# Patient Record
Sex: Female | Born: 1960
Health system: Southern US, Community
[De-identification: ages and names within clinical notes are randomized; demographics above are authoritative.]

## PROBLEM LIST (undated history)

## (undated) DIAGNOSIS — E119 Type 2 diabetes mellitus without complications: Secondary | ICD-10-CM

## (undated) DIAGNOSIS — E785 Hyperlipidemia, unspecified: Secondary | ICD-10-CM

## (undated) DIAGNOSIS — I10 Essential (primary) hypertension: Secondary | ICD-10-CM

## (undated) HISTORY — DX: Type 2 diabetes mellitus without complications: E11.9

## (undated) HISTORY — PX: ANKLE SURGERY: SHX546

## (undated) HISTORY — DX: Essential (primary) hypertension: I10

## (undated) HISTORY — DX: Hyperlipidemia, unspecified: E78.5

---

## 2015-10-29 ENCOUNTER — Encounter: Payer: Self-pay | Admitting: Family Medicine

## 2015-10-29 ENCOUNTER — Ambulatory Visit (INDEPENDENT_AMBULATORY_CARE_PROVIDER_SITE_OTHER): Payer: Self-pay | Admitting: Family Medicine

## 2015-10-29 VITALS — BP 129/60 | HR 82 | Ht 61.0 in | Wt 192.0 lb

## 2015-10-29 DIAGNOSIS — L649 Androgenic alopecia, unspecified: Secondary | ICD-10-CM | POA: Insufficient documentation

## 2015-10-29 DIAGNOSIS — E1165 Type 2 diabetes mellitus with hyperglycemia: Secondary | ICD-10-CM

## 2015-10-29 DIAGNOSIS — E669 Obesity, unspecified: Secondary | ICD-10-CM

## 2015-10-29 DIAGNOSIS — F17200 Nicotine dependence, unspecified, uncomplicated: Secondary | ICD-10-CM

## 2015-10-29 DIAGNOSIS — I10 Essential (primary) hypertension: Secondary | ICD-10-CM | POA: Insufficient documentation

## 2015-10-29 DIAGNOSIS — IMO0001 Reserved for inherently not codable concepts without codable children: Secondary | ICD-10-CM | POA: Insufficient documentation

## 2015-10-29 DIAGNOSIS — E1159 Type 2 diabetes mellitus with other circulatory complications: Secondary | ICD-10-CM | POA: Insufficient documentation

## 2015-10-29 DIAGNOSIS — I152 Hypertension secondary to endocrine disorders: Secondary | ICD-10-CM | POA: Insufficient documentation

## 2015-10-29 DIAGNOSIS — Z720 Tobacco use: Secondary | ICD-10-CM

## 2015-10-29 DIAGNOSIS — E782 Mixed hyperlipidemia: Secondary | ICD-10-CM | POA: Insufficient documentation

## 2015-10-29 NOTE — Patient Instructions (Signed)
Thank you for coming in today. Increase metformin to 1000mg  twice daily  Return in 1-3 months or sooner if needed.   Work on smoking cessation.

## 2015-10-29 NOTE — Progress Notes (Signed)
Melanie Nelson is a 55 y.o. female who presents [name] today to establish care.  Patient's only complaint is difficulty walking since her left ankle surgery to remove a neuroma years ago.  Denies numbness, tingling, and weakness.  Her medical problems being addressed during this visit are DM, HTN, HLD, and tobacco abuse.    Diabetes Mellitus: Says her sugar has been around 180 in the am and 100 at night after stopping her Jardiance 25 mg 2 weeks ago due to lack of insurance.  Patient states her A1c was 9.0 four months ago.  Admits to gaining 10 pounds over the last couple months and feeling more stressed because her family recently moved here from [place].  Patient reports decreased exercise and increased processed food.  Her last foot and eye exam were last year.  Denies polyuria and polydipsia.  Also takes aspirin 81 mg daily.    Hypertension:  BP controlled to 120-130s/ 80 at home per patient.  Elevated to 164/89 in the office.  Currently taking lisinopril 20 mg daily and metoprolol 100 mg daily. Denies chest pain and palpitations.    Tobacco abuse:  Currently smokes 1/3 to 1/2 pack per day for the last 20 years.  Has tried quitting but is not interested at this time due to stress from recent move.    Past Medical History  Diagnosis Date  . Diabetes mellitus without complication (HCC)   . Hypertension   . Hyperlipidemia    Past Surgical History  Procedure Laterality Date  . Ankle surgery     Social History  Substance Use Topics  . Smoking status: Current Every Day Smoker -- 0.50 packs/day for 20 years  . Smokeless tobacco: Not on file  . Alcohol Use: 0.0 oz/week    0 Standard drinks or equivalent per week     Comment: Drinks 6-7 beers in one day socially every couple weeks   family history includes Alcohol abuse in her father; Aneurysm in her sister;  Diabetes in her mother; Heart disease in her father.  ROS as above No headache, visual changes, nausea, vomiting, diarrhea, constipation, dizziness, abdominal pain, skin rash, fevers, chills, night sweats, weight loss, swollen lymph nodes, body aches, joint swelling, muscle aches, chest pain, shortness of breath, mood changes, visual or auditory hallucinations.    Medications: Current Outpatient Prescriptions  Medication Sig Dispense Refill  . glimepiride (AMARYL) 4 MG tablet Take 4 mg by mouth daily with breakfast.    . LISINOPRIL PO Take 20 mg by mouth.     . metFORMIN (GLUCOPHAGE) 500 MG tablet Take 500 mg by mouth 2 (two) times daily with a meal.    . metoprolol (LOPRESSOR) 100 MG tablet Take 100 mg by mouth 2 (two) times daily.    . rosuvastatin (CRESTOR) 40 MG tablet Take 40 mg by mouth daily.     No current facility-administered medications for this visit.   Allergies  Allergen Reactions  . Sulfa Antibiotics      Exam:  BP 129/60 mmHg  Pulse 82  Ht 5\' 1"  (1.549 m)  Wt 192 lb (87.091 kg)  BMI 36.30 kg/m2 Gen: Well NAD, smells of cigarette smoke. HEENT:  MMM Lungs: Normal work of breathing. CTABL Heart: RRR no MRG Abd: NABS, Soft. Nondistended, Nontender, Obese Exts: Patellar reflexes 2+.  Strength 5/5 bilaterally in her lower extremities.  Sensation intact to light touch.   Scalp: Alopecia pattern consistent with female  pattern type.  No results found for this or any previous visit (from the past 24 hour(s)). No results found.    Assessment and Plan: 55 y.o. female with multiple comorbid conditions presents to establish care.  Her care is complicated by recent loss of health insurance. Fortunately she expects to regain health insurance in the next month or so. Therefore think it's reasonable to defer extensive laboratory assessment at least for a few months until it will be more affordable for her.   Will increase her metformin to 1000 mg BID.  Follow up in 1-3 months  for recheck of blood pressure, labs, and health maintenance.  Encouraged her to cut back  cigarette smoking and to increase walking.      Discussed warning signs or symptoms. Please see discharge instructions. Patient expresses understanding.

## 2015-11-13 ENCOUNTER — Telehealth: Payer: Self-pay

## 2015-11-13 NOTE — Telephone Encounter (Signed)
[facility] called and state they tried to transfer Melanie Nelson's medication and some how it was lost in the transfer. She needs refill on all her medications. The medications have an historical provider. She needs Lisinopril 20 mg once daily, glimepiride 4 mg once daily, Crestor 40 mg once daily and Metoprolol 100 mg bid. [facility] [employer] [address]

## 2015-11-16 MED ORDER — GLIMEPIRIDE 4 MG PO TABS: 4.0000 mg | ORAL_TABLET | Freq: Every day | ORAL | 0 refills | Status: DC

## 2015-11-16 MED ORDER — LISINOPRIL 20 MG PO TABS: 20.0000 mg | ORAL_TABLET | Freq: Every day | ORAL | 0 refills | Status: DC

## 2015-11-16 MED ORDER — ROSUVASTATIN CALCIUM 40 MG PO TABS: 40.0000 mg | ORAL_TABLET | Freq: Every day | ORAL | 0 refills | Status: DC

## 2015-11-16 MED ORDER — METOPROLOL TARTRATE 100 MG PO TABS: 100.0000 mg | ORAL_TABLET | Freq: Two times a day (BID) | ORAL | 0 refills | Status: DC

## 2015-11-16 NOTE — Telephone Encounter (Signed)
Melanie Nelson does not have insurance and the Crestor cost is around $ 400. She would like a cheaper statin. Please advise.

## 2015-11-16 NOTE — Telephone Encounter (Signed)
Meds refilled.

## 2015-11-17 MED ORDER — ATORVASTATIN CALCIUM 40 MG PO TABS: 40.0000 mg | ORAL_TABLET | Freq: Every day | ORAL | 0 refills | Status: DC

## 2015-11-17 NOTE — Addendum Note (Signed)
Addended by: Rodolph BongOREY, Shayde Gervacio S on: 11/17/2015 07:28 AM   Modules accepted: Orders

## 2015-11-17 NOTE — Telephone Encounter (Signed)
Patient advised of recommendations.  

## 2015-11-17 NOTE — Telephone Encounter (Signed)
Will switch to lipitor

## 2015-12-03 ENCOUNTER — Encounter: Payer: Self-pay | Admitting: Family Medicine

## 2016-01-26 LAB — CBC
HEMATOCRIT: 42 % (ref 35.0–45.0)
HEMOGLOBIN: 13.8 g/dL (ref 11.7–15.5)
MCH: 28.6 pg (ref 27.0–33.0)
MCHC: 32.9 g/dL (ref 32.0–36.0)
MCV: 87 fL (ref 80.0–100.0)
MPV: 9.8 fL (ref 7.5–12.5)
PLATELETS: 228 10*3/uL (ref 140–400)
RBC: 4.83 MIL/uL (ref 3.80–5.10)
RDW: 14.1 % (ref 11.0–15.0)
WBC: 8.5 10*3/uL (ref 3.8–10.8)

## 2016-01-26 LAB — HEMOGLOBIN A1C
HEMOGLOBIN A1C: 9.2 % — AB (ref <5.7)
MEAN PLASMA GLUCOSE: 217 mg/dL

## 2016-01-27 LAB — COMPREHENSIVE METABOLIC PANEL
ALBUMIN: 4.1 g/dL (ref 3.6–5.1)
ALT: 37 U/L — ABNORMAL HIGH (ref 6–29)
AST: 25 U/L (ref 10–35)
Alkaline Phosphatase: 66 U/L (ref 33–130)
BUN: 21 mg/dL (ref 7–25)
CALCIUM: 9.1 mg/dL (ref 8.6–10.4)
CHLORIDE: 102 mmol/L (ref 98–110)
CO2: 24 mmol/L (ref 20–31)
Creat: 0.65 mg/dL (ref 0.50–1.05)
Glucose, Bld: 308 mg/dL — ABNORMAL HIGH (ref 65–99)
POTASSIUM: 4.4 mmol/L (ref 3.5–5.3)
Sodium: 138 mmol/L (ref 135–146)
TOTAL PROTEIN: 6.5 g/dL (ref 6.1–8.1)
Total Bilirubin: 0.4 mg/dL (ref 0.2–1.2)

## 2016-01-27 LAB — LIPID PANEL
CHOLESTEROL: 173 mg/dL (ref 125–200)
HDL: 26 mg/dL — AB (ref >=46)
LDL Cholesterol: 81 mg/dL (ref <130)
TRIGLYCERIDES: 330 mg/dL — AB (ref <150)
Total CHOL/HDL Ratio: 6.7 Ratio — ABNORMAL HIGH (ref <=5.0)
VLDL: 66 mg/dL — ABNORMAL HIGH (ref <30)

## 2016-01-28 ENCOUNTER — Ambulatory Visit (INDEPENDENT_AMBULATORY_CARE_PROVIDER_SITE_OTHER): Payer: BLUE CROSS/BLUE SHIELD | Admitting: Family Medicine

## 2016-01-28 VITALS — BP 163/78 | HR 70 | Wt 197.0 lb

## 2016-01-28 DIAGNOSIS — E6609 Other obesity due to excess calories: Secondary | ICD-10-CM

## 2016-01-28 DIAGNOSIS — Z6836 Body mass index (BMI) 36.0-36.9, adult: Secondary | ICD-10-CM

## 2016-01-28 DIAGNOSIS — J302 Other seasonal allergic rhinitis: Secondary | ICD-10-CM | POA: Insufficient documentation

## 2016-01-28 DIAGNOSIS — E1165 Type 2 diabetes mellitus with hyperglycemia: Secondary | ICD-10-CM | POA: Diagnosis not present

## 2016-01-28 DIAGNOSIS — I10 Essential (primary) hypertension: Secondary | ICD-10-CM | POA: Diagnosis not present

## 2016-01-28 DIAGNOSIS — J301 Allergic rhinitis due to pollen: Secondary | ICD-10-CM

## 2016-01-28 DIAGNOSIS — IMO0001 Reserved for inherently not codable concepts without codable children: Secondary | ICD-10-CM

## 2016-01-28 DIAGNOSIS — E782 Mixed hyperlipidemia: Secondary | ICD-10-CM | POA: Diagnosis not present

## 2016-01-28 DIAGNOSIS — Z23 Encounter for immunization: Secondary | ICD-10-CM | POA: Diagnosis not present

## 2016-01-28 DIAGNOSIS — Z1239 Encounter for other screening for malignant neoplasm of breast: Secondary | ICD-10-CM

## 2016-01-28 DIAGNOSIS — F17200 Nicotine dependence, unspecified, uncomplicated: Secondary | ICD-10-CM

## 2016-01-28 DIAGNOSIS — Z1211 Encounter for screening for malignant neoplasm of colon: Secondary | ICD-10-CM

## 2016-01-28 MED ORDER — FLUTICASONE PROPIONATE 50 MCG/ACT NA SUSP: 2.0000 | Freq: Every day | NASAL | 12 refills | Status: DC

## 2016-01-28 MED ORDER — EMPAGLIFLOZIN 25 MG PO TABS: 25.0000 mg | ORAL_TABLET | Freq: Every day | ORAL | 3 refills | Status: DC

## 2016-01-28 MED ORDER — ROSUVASTATIN CALCIUM 40 MG PO TABS: 40.0000 mg | ORAL_TABLET | Freq: Every day | ORAL | 3 refills | Status: DC

## 2016-01-28 MED ORDER — LISINOPRIL 20 MG PO TABS: 20.0000 mg | ORAL_TABLET | Freq: Every day | ORAL | 3 refills | Status: DC

## 2016-01-28 MED ORDER — METOPROLOL TARTRATE 100 MG PO TABS: 100.0000 mg | ORAL_TABLET | Freq: Two times a day (BID) | ORAL | 3 refills | Status: DC

## 2016-01-28 MED ORDER — SITAGLIPTIN PHOS-METFORMIN HCL 50-1000 MG PO TABS: 1.0000 | ORAL_TABLET | Freq: Two times a day (BID) | ORAL | 3 refills | Status: DC

## 2016-01-28 MED ORDER — GLUCOSE BLOOD VI STRP: ORAL_STRIP | 12 refills | Status: DC

## 2016-01-28 NOTE — Progress Notes (Signed)
Melanie Nelson is a 55 y.o. female who [name] today for follow-up diabetes hyperlipidemia and hypertension.  Patient was originally seen when she did not have health insurance and wanted to defer extensive workup until she obtained health insurance. She now has health insurance.  Diabetes: Poorly controlled recently. She patient has elevated blood sugars. No polyuria or polydipsia fevers or chills. She currently takes Amaryl and metformin. In the past she did very well with Jardiance.  Hypertension: Managed with lisinopril and metoprolol. No chest pain palpitations or shortness of breath.  Hyperlipidemia: Managed with Crestor. No muscle aches or pain.  Patient notes runny nose cough congestion and itchy scratchy eyes. Symptoms consistent with previous allergy symptoms. Patient would like a prescription for Flonase nasal spray if possible.  Health maintenance: Patient is due for colonoscopy and mammogram as well as vaccines listed below.   Past Medical History:  Diagnosis Date  . Diabetes mellitus without complication (HCC)   . Hyperlipidemia   . Hypertension    Past Surgical History:  Procedure Laterality Date  . ANKLE SURGERY     Social History  Substance Use Topics  . Smoking status: Current Every Day Smoker    Packs/day: 0.50    Years: 20.00  . Smokeless tobacco: Not on file  . Alcohol use 0.0 oz/week     Comment: Drinks 6-7 beers in one day socially every couple weeks   family history includes Alcohol abuse in her father; Aneurysm in her sister; Diabetes in her mother; Heart disease in her father.  ROS as above:  Medications: Current Outpatient Prescriptions  Medication Sig Dispense Refill  . lisinopril (PRINIVIL,ZESTRIL) 20 MG tablet Take 1 tablet (20 mg total) by mouth daily. 90 tablet 3  . metoprolol (LOPRESSOR) 100 MG tablet  Take 1 tablet (100 mg total) by mouth 2 (two) times daily. 180 tablet 3  . rosuvastatin (CRESTOR) 40 MG tablet Take 1 tablet (40 mg total) by mouth daily. 90 tablet 3  . empagliflozin (JARDIANCE) 25 MG TABS tablet Take 25 mg by mouth daily. 90 tablet 3  . fluticasone (FLONASE) 50 MCG/ACT nasal spray Place 2 sprays into both nostrils daily. 16 g 12  . glucose blood (BAYER CONTOUR NEXT TEST) test strip Test daily. E11.65 100 each 12  . sitaGLIPtin-metformin (JANUMET) 50-1000 MG tablet Take 1 tablet by mouth 2 (two) times daily with a meal. 180 tablet 3   No current facility-administered medications for this visit.    Allergies  Allergen Reactions  . Sulfa Antibiotics     Health Maintenance Health Maintenance  Topic Date Due  . Hepatitis C Screening  04-06-1961  . PNEUMOCOCCAL POLYSACCHARIDE VACCINE (1) 06/11/1961  . FOOT EXAM  06/11/1969  . OPHTHALMOLOGY EXAM  06/11/1969  . HIV Screening  06/12/1974  . TETANUS/TDAP  06/12/1978  . PAP SMEAR  06/11/1980  . MAMMOGRAM  06/11/2009  . COLONOSCOPY  06/11/2009  . INFLUENZA VACCINE  11/10/2015  . HEMOGLOBIN A1C  07/26/2016     Exam:  BP (!) 163/78   Pulse 70   Wt 197 lb (89.4 kg)   BMI 37.22 kg/m  Gen: Well NAD HEENT: EOMI,  MMM Clear nasal discharge Lungs: Normal work of breathing. CTABL Heart: RRR no MRG Abd: NABS, Soft. Nondistended, Nontender Exts: Brisk capillary refill, warm and well perfused.  Diabetic Foot Exam - Simple   Simple Foot Form Visual Inspection No deformities, no ulcerations, no other skin  breakdown bilaterally:  Yes Sensation Testing Intact to touch and monofilament testing bilaterally:  Yes Pulse Check Posterior Tibialis and Dorsalis pulse intact bilaterally:  Yes Comments       Results for orders placed or performed in visit on 10/29/15 (from the past 72 hour(s))  CBC     Status: None   Collection Time: 01/26/16  8:18 AM  Result Value Ref Range   WBC 8.5 3.8 - 10.8 K/uL   RBC 4.83 3.80 - 5.10  MIL/uL   Hemoglobin 13.8 11.7 - 15.5 g/dL   HCT 81.1 91.4 - 78.2 %   MCV 87.0 80.0 - 100.0 fL   MCH 28.6 27.0 - 33.0 pg   MCHC 32.9 32.0 - 36.0 g/dL   RDW 95.6 21.3 - 08.6 %   Platelets 228 140 - 400 K/uL   MPV 9.8 7.5 - 12.5 fL  Comprehensive metabolic panel     Status: Abnormal   Collection Time: 01/26/16  8:18 AM  Result Value Ref Range   Sodium 138 135 - 146 mmol/L   Potassium 4.4 3.5 - 5.3 mmol/L   Chloride 102 98 - 110 mmol/L   CO2 24 20 - 31 mmol/L   Glucose, Bld 308 (H) 65 - 99 mg/dL   BUN 21 7 - 25 mg/dL   Creat 5.78 4.69 - 6.29 mg/dL    Comment:   For patients > or = 56 years of age: The upper reference limit for Creatinine is approximately 13% higher for people identified as African-American.      Total Bilirubin 0.4 0.2 - 1.2 mg/dL   Alkaline Phosphatase 66 33 - 130 U/L   AST 25 10 - 35 U/L   ALT 37 (H) 6 - 29 U/L   Total Protein 6.5 6.1 - 8.1 g/dL   Albumin 4.1 3.6 - 5.1 g/dL   Calcium 9.1 8.6 - 52.8 mg/dL  Hemoglobin U1L     Status: Abnormal   Collection Time: 01/26/16  8:18 AM  Result Value Ref Range   Hgb A1c MFr Bld 9.2 (H) <5.7 %    Comment:   For someone without known diabetes, a hemoglobin A1c value of 6.5% or greater indicates that they may have diabetes and this should be confirmed with a follow-up test.   For someone with known diabetes, a value <7% indicates that their diabetes is well controlled and a value greater than or equal to 7% indicates suboptimal control. A1c targets should be individualized based on duration of diabetes, age, comorbid conditions, and other considerations.   Currently, no consensus exists for use of hemoglobin A1c for diagnosis of diabetes for children.      Mean Plasma Glucose 217 mg/dL  Lipid panel     Status: Abnormal   Collection Time: 01/26/16  8:18 AM  Result Value Ref Range   Cholesterol 173 125 - 200 mg/dL   Triglycerides 244 (H) <150 mg/dL   HDL 26 (L) >=01 mg/dL   Total CHOL/HDL Ratio 6.7 (H) <=5.0  Ratio   VLDL 66 (H) <30 mg/dL   LDL Cholesterol 81 <027 mg/dL    Comment:   Total Cholesterol/HDL Ratio:CHD Risk                        Coronary Heart Disease Risk Table  Men       Women          1/2 Average Risk              3.4        3.3              Average Risk              5.0        4.4           2X Average Risk              9.6        7.1           3X Average Risk             23.4       11.0 Use the calculated Patient Ratio above and the CHD Risk table  to determine the patient's CHD Risk.    No results found.    Assessment and Plan: 55 y.o. female with  1) diabetes: Not well controlled. Stop Amaryl. Stop metformin. Start [place]. Start Janumet. Recheck in 3 months.  2) hypertension: Blood pressure bit elevated today. It has been previously well controlled. Recheck at the next visit.  3) hyperlipidemia: Reasonably well-controlled. Continue Crestor.  4) seasonal allergies: Prescribed Flonase   5) colon cancer screening: Refer to gastroenterology   6) breast cancer screening: Mammogram ordered   Tdap, Pneumovax, and influenza vaccines given today.      Orders Placed This Encounter  Procedures  . MM DIGITAL SCREENING BILATERAL    Standing Status:   Future    Standing Expiration Date:   03/29/2017    Order Specific Question:   Is the patient pregnant?    Answer:   No    Order Specific Question:   Preferred imaging location?    Answer:   [facility]    Order Specific Question:   Reason for exam:    Answer:   breast cancer screening  . Tdap vaccine greater than or equal to 7yo IM  . Flu Vaccine QUAD 36+ mos IM  . Pneumococcal polysaccharide vaccine 23-valent greater than or equal to 2yo subcutaneous/IM  . Ambulatory referral to Gastroenterology    Referral Priority:   Routine    Referral Type:   Consultation    Referral Reason:   Specialty Services Required    Requested Specialty:   Gastroenterology    Number  of Visits Requested:   1    Discussed warning signs or symptoms. Please see discharge instructions. Patient expresses understanding.

## 2016-01-28 NOTE — Patient Instructions (Addendum)
Thank you for coming in today. STOP Amryl and Metformin.  START Janumet and Jardiace.  Continue other medicines.  Return in 3 months.  Use OTC zyrtec and flonase as needed.   Call or go to the emergency room if you get worse, have trouble breathing, have chest pains, or palpitations.

## 2016-02-01 ENCOUNTER — Encounter: Payer: Self-pay | Admitting: *Deleted

## 2016-02-01 ENCOUNTER — Telehealth: Payer: Self-pay | Admitting: *Deleted

## 2016-02-01 NOTE — Telephone Encounter (Signed)
PA faxed for Janumet

## 2016-02-04 NOTE — Telephone Encounter (Signed)
janumet has been denied. Rejection message from insurance company state they want patient to have tried and failed Kombiglyze XR

## 2016-02-05 MED ORDER — SAXAGLIPTIN-METFORMIN ER 2.5-1000 MG PO TB24: 1.0000 | ORAL_TABLET | Freq: Two times a day (BID) | ORAL | 3 refills | Status: DC

## 2016-02-05 NOTE — Telephone Encounter (Signed)
Patient notified; she states she has been trying to watch her carbs and sugars in her diet. She has been checking her blood sugar and it has been anywhere from 120-160 on the jardiance. Patient will f/u in [place]

## 2016-02-05 NOTE — Telephone Encounter (Signed)
Will switch to Kombiglyze XR

## 2016-02-10 ENCOUNTER — Ambulatory Visit (INDEPENDENT_AMBULATORY_CARE_PROVIDER_SITE_OTHER): Payer: BLUE CROSS/BLUE SHIELD

## 2016-02-10 DIAGNOSIS — Z1231 Encounter for screening mammogram for malignant neoplasm of breast: Secondary | ICD-10-CM

## 2016-02-10 DIAGNOSIS — Z1239 Encounter for other screening for malignant neoplasm of breast: Secondary | ICD-10-CM

## 2016-03-25 LAB — HM COLONOSCOPY

## 2016-03-28 ENCOUNTER — Encounter: Payer: Self-pay | Admitting: Family Medicine

## 2016-03-28 DIAGNOSIS — K5730 Diverticulosis of large intestine without perforation or abscess without bleeding: Secondary | ICD-10-CM | POA: Insufficient documentation

## 2016-03-28 DIAGNOSIS — K635 Polyp of colon: Secondary | ICD-10-CM | POA: Insufficient documentation

## 2016-04-27 ENCOUNTER — Encounter: Payer: Self-pay | Admitting: Family Medicine

## 2016-04-29 ENCOUNTER — Ambulatory Visit: Payer: BLUE CROSS/BLUE SHIELD | Admitting: Family Medicine

## 2016-05-19 ENCOUNTER — Telehealth: Payer: Self-pay | Admitting: Family Medicine

## 2016-05-19 NOTE — Telephone Encounter (Signed)
This is not my patient. Please route to the right provider.

## 2016-05-19 NOTE — Telephone Encounter (Signed)
Pt called. She has an appt for DM chk on 2/26 and would like lab order to be sent down on 2/21.  Thank you.

## 2016-06-06 ENCOUNTER — Ambulatory Visit (INDEPENDENT_AMBULATORY_CARE_PROVIDER_SITE_OTHER): Payer: BLUE CROSS/BLUE SHIELD | Admitting: Family Medicine

## 2016-06-06 VITALS — BP 118/70 | HR 70 | Wt 183.0 lb

## 2016-06-06 DIAGNOSIS — IMO0001 Reserved for inherently not codable concepts without codable children: Secondary | ICD-10-CM

## 2016-06-06 DIAGNOSIS — E6609 Other obesity due to excess calories: Secondary | ICD-10-CM

## 2016-06-06 DIAGNOSIS — E1165 Type 2 diabetes mellitus with hyperglycemia: Secondary | ICD-10-CM | POA: Diagnosis not present

## 2016-06-06 DIAGNOSIS — Z6836 Body mass index (BMI) 36.0-36.9, adult: Secondary | ICD-10-CM | POA: Diagnosis not present

## 2016-06-06 DIAGNOSIS — I10 Essential (primary) hypertension: Secondary | ICD-10-CM

## 2016-06-06 DIAGNOSIS — E782 Mixed hyperlipidemia: Secondary | ICD-10-CM | POA: Diagnosis not present

## 2016-06-06 LAB — POCT GLYCOSYLATED HEMOGLOBIN (HGB A1C): HEMOGLOBIN A1C: 8

## 2016-06-06 MED ORDER — METOPROLOL SUCCINATE ER 100 MG PO TB24: 100.0000 mg | ORAL_TABLET | Freq: Every day | ORAL | 3 refills | Status: DC

## 2016-06-06 MED ORDER — SAXAGLIPTIN-METFORMIN ER 5-500 MG PO TB24: 1.0000 | ORAL_TABLET | Freq: Every day | ORAL | 3 refills | Status: DC

## 2016-06-06 NOTE — Patient Instructions (Signed)
Thank you for coming in today. Switch to the extended release form of Metoprolol.  Take this daily.  Switch to the daily form of Kombglyze and take daily.   Recheck in about 3 months.  We should make the next visit a well visit with pap smear.   Try to get eye exam records sent to me.   The next time we draw blood we will catch up fully on screening tests.   Continue to exercise and lose weight because that will keep the insulin away.    Diabetes Mellitus and Exercise Exercising regularly is important for your overall health, especially when you have diabetes (diabetes mellitus). Exercising is not only about losing weight. It has many health benefits, such as increasing muscle strength and bone density and reducing body fat and stress. This leads to improved fitness, flexibility, and endurance, all of which result in better overall health. Exercise has additional benefits for people with diabetes, including:  Reducing appetite.  Helping to lower and control blood glucose.  Lowering blood pressure.  Helping to control amounts of fatty substances (lipids) in the blood, such as cholesterol and triglycerides.  Helping the body to respond better to insulin (improving insulin sensitivity).  Reducing how much insulin the body needs.  Decreasing the risk for heart disease by:  Lowering cholesterol and triglyceride levels.  Increasing the levels of good cholesterol.  Lowering blood glucose levels. What is my activity plan? Your health care provider or certified diabetes educator can help you make a plan for the type and frequency of exercise (activity plan) that works for you. Make sure that you:  Do at least 150 minutes of moderate-intensity or vigorous-intensity exercise each week. This could be brisk walking, biking, or water aerobics.  Do stretching and strength exercises, such as yoga or weightlifting, at least 2 times a week.  Spread out your activity over at least 3 days of  the week.  Get some form of physical activity every day.  Do not go more than 2 days in a row without some kind of physical activity.  Avoid being inactive for more than 90 minutes at a time. Take frequent breaks to walk or stretch.  Choose a type of exercise or activity that you enjoy, and set realistic goals.  Start slowly, and gradually increase the intensity of your exercise over time. What do I need to know about managing my diabetes?  Check your blood glucose before and after exercising.  If your blood glucose is higher than 240 mg/dL (16.1 mmol/L) before you exercise, check your urine for ketones. If you have ketones in your urine, do not exercise until your blood glucose returns to normal.  Know the symptoms of low blood glucose (hypoglycemia) and how to treat it. Your risk for hypoglycemia increases during and after exercise. Common symptoms of hypoglycemia can include:  Hunger.  Anxiety.  Sweating and feeling clammy.  Confusion.  Dizziness or feeling light-headed.  Increased heart rate or palpitations.  Blurry vision.  Tingling or numbness around the mouth, lips, or tongue.  Tremors or shakes.  Irritability.  Keep a rapid-acting carbohydrate snack available before, during, and after exercise to help prevent or treat hypoglycemia.  Avoid injecting insulin into areas of the body that are going to be exercised. For example, avoid injecting insulin into:  The arms, when playing tennis.  The legs, when jogging.  Keep records of your exercise habits. Doing this can help you and your health care provider adjust your diabetes  management plan as needed. Write down:  Food that you eat before and after you exercise.  Blood glucose levels before and after you exercise.  The type and amount of exercise you have done.  When your insulin is expected to peak, if you use insulin. Avoid exercising at times when your insulin is peaking.  When you start a new exercise  or activity, work with your health care provider to make sure the activity is safe for you, and to adjust your insulin, medicines, or food intake as needed.  Drink plenty of water while you exercise to prevent dehydration or heat stroke. Drink enough fluid to keep your urine clear or pale yellow. This information is not intended to replace advice given to you by your health care provider. Make sure you discuss any questions you have with your health care provider. Document Released: 06/18/2003 Document Revised: 10/16/2015 Document Reviewed: 09/07/2015 Elsevier Interactive Patient Education  2017 [employer]

## 2016-06-06 NOTE — Progress Notes (Signed)
Melanie RamsGloria Schweiss is a 56 y.o. female who [name] today for follow-up diabetes hypertension and hyperlipidemia.  Diabetes: Patient currently takes Jardiance 25 mg daily, and Kombiglyze Xr 2.5-1000mg  daily. She cannot tolerate the PM dose of the Kombiglyze due to nausea. She denies polyuria or polydipsia. She notes her fasting blood sugars are typically 160 or less. She feels well.  Patient has started a weight loss program with improved diet and exercise.  Hypertension: Patient takes lisinopril and immediate release metoprolol once daily. She notes she forgets to take the p.m. dose of metoprolol. She denies chest pain palpitations or shortness of breath lightheadedness or dizziness.  Hyperlipidemia: Patient takes Crestor daily with no muscle aches or pain or abdominal pain.   Past Medical History:  Diagnosis Date  . Diabetes mellitus without complication (HCC)   . Hyperlipidemia   . Hypertension    Past Surgical History:  Procedure Laterality Date  . ANKLE SURGERY     Social History  Substance Use Topics  . Smoking status: Current Every Day Smoker    Packs/day: 0.50    Years: 20.00  . Smokeless tobacco: Not on file  . Alcohol use 0.0 oz/week     Comment: Drinks 6-7 beers in one day socially every couple weeks   family history includes Alcohol abuse in her father; Aneurysm in her sister; Diabetes in her mother; Heart disease in her father.  ROS as above:  Medications: Current Outpatient Prescriptions  Medication Sig Dispense Refill  . empagliflozin (JARDIANCE) 25 MG TABS tablet Take 25 mg by mouth daily. 90 tablet 3  . fluticasone (FLONASE) 50 MCG/ACT nasal spray Place 2 sprays into both nostrils daily. 16 g 12  . glucose blood (BAYER CONTOUR NEXT TEST) test strip Test daily. E11.65 100 each 12  . lisinopril (PRINIVIL,ZESTRIL) 20 MG tablet Take  1 tablet (20 mg total) by mouth daily. 90 tablet 3  . metoprolol succinate (TOPROL-XL) 100 MG 24 hr tablet Take 1 tablet (100 mg total) by mouth daily. Take with or immediately following a meal. 90 tablet 3  . rosuvastatin (CRESTOR) 40 MG tablet Take 1 tablet (40 mg total) by mouth daily. 90 tablet 3  . Saxagliptin-Metformin 5-500 MG TB24 Take 1 tablet by mouth daily. 90 tablet 3   No current facility-administered medications for this visit.    Allergies  Allergen Reactions  . Sulfa Antibiotics     Health Maintenance Health Maintenance  Topic Date Due  . Hepatitis C Screening  Aug 24, 1960  . OPHTHALMOLOGY EXAM  06/11/1969  . HIV Screening  06/12/1974  . PAP SMEAR  06/11/1980  . HEMOGLOBIN A1C  12/04/2016  . FOOT EXAM  06/06/2017  . MAMMOGRAM  02/09/2018  . COLONOSCOPY  03/26/2019  . PNEUMOCOCCAL POLYSACCHARIDE VACCINE (2) 01/27/2021  . TETANUS/TDAP  01/27/2026  . INFLUENZA VACCINE  Completed     Exam:  BP 118/70   Pulse 70   Wt 183 lb (83 kg)   BMI 34.58 kg/m  Gen: Well NAD HEENT: EOMI,  MMM Lungs: Normal work of breathing. CTABL Heart: RRR no MRG Abd: NABS, Soft. Nondistended, Nontender Exts: Brisk capillary refill, warm and well perfused.  Diabetic Foot Exam - Simple   Simple Foot Form Diabetic Foot exam was performed with the following findings:  Yes 06/06/2016 11:21 AM  Visual Inspection No deformities, no ulcerations, no other skin breakdown bilaterally:  Yes Sensation Testing Intact to touch and monofilament testing  bilaterally:  Yes Pulse Check Posterior Tibialis and Dorsalis pulse intact bilaterally:  Yes Comments       Results for orders placed or performed in visit on 06/06/16 (from the past 72 hour(s))  POCT HgB A1C     Status: None   Collection Time: 06/06/16  9:05 AM  Result Value Ref Range   Hemoglobin A1C 8.0    No results found.    Assessment and Plan: 56 y.o. female with  Diabetes: Significantly improved. I think her some room for  improvement. We'll increase the Saxagliptin dose to 5 mg daily. I don't think patient will ever take twice daily medicine.  Recheck in 3 months. Patient will also continue to work on diet and exercise. She's lost 15 pounds in the last 3 months.  Hypertension: Goal. Switch to extended release metoprolol. Recheck in about 3 months.  Hyperlipidemia: Doing well. Continue current regimen.  Health maintenance:  Plan for well visit with Pap smear in the near future. We'll try to get records from ophthalmology.     Orders Placed This Encounter  Procedures  . POCT HgB A1C   Meds ordered this encounter  Medications  . metoprolol succinate (TOPROL-XL) 100 MG 24 hr tablet    Sig: Take 1 tablet (100 mg total) by mouth daily. Take with or immediately following a meal.    Dispense:  90 tablet    Refill:  3  . Saxagliptin-Metformin 5-500 MG TB24    Sig: Take 1 tablet by mouth daily.    Dispense:  90 tablet    Refill:  3     Discussed warning signs or symptoms. Please see discharge instructions. Patient expresses understanding.

## 2016-09-07 ENCOUNTER — Ambulatory Visit (INDEPENDENT_AMBULATORY_CARE_PROVIDER_SITE_OTHER): Payer: BLUE CROSS/BLUE SHIELD | Admitting: Family Medicine

## 2016-09-07 ENCOUNTER — Other Ambulatory Visit (HOSPITAL_COMMUNITY)
Admission: RE | Admit: 2016-09-07 | Discharge: 2016-09-07 | Disposition: A | Discharge status: Home / Self Care | Payer: BLUE CROSS/BLUE SHIELD | Source: Ambulatory Visit | Attending: Family Medicine | Admitting: Family Medicine

## 2016-09-07 VITALS — BP 144/93 | HR 104 | Temp 98.3°F | Wt 186.0 lb

## 2016-09-07 DIAGNOSIS — Z1159 Encounter for screening for other viral diseases: Secondary | ICD-10-CM

## 2016-09-07 DIAGNOSIS — M25511 Pain in right shoulder: Secondary | ICD-10-CM

## 2016-09-07 DIAGNOSIS — IMO0001 Reserved for inherently not codable concepts without codable children: Secondary | ICD-10-CM

## 2016-09-07 DIAGNOSIS — Z114 Encounter for screening for human immunodeficiency virus [HIV]: Secondary | ICD-10-CM | POA: Diagnosis not present

## 2016-09-07 DIAGNOSIS — E1165 Type 2 diabetes mellitus with hyperglycemia: Secondary | ICD-10-CM | POA: Diagnosis not present

## 2016-09-07 DIAGNOSIS — Z124 Encounter for screening for malignant neoplasm of cervix: Secondary | ICD-10-CM | POA: Insufficient documentation

## 2016-09-07 DIAGNOSIS — I10 Essential (primary) hypertension: Secondary | ICD-10-CM | POA: Diagnosis not present

## 2016-09-07 LAB — COMPLETE METABOLIC PANEL WITH GFR
ALBUMIN: 4.6 g/dL (ref 3.6–5.1)
ALK PHOS: 61 U/L (ref 33–130)
ALT: 22 U/L (ref 6–29)
AST: 16 U/L (ref 10–35)
BILIRUBIN TOTAL: 0.3 mg/dL (ref 0.2–1.2)
BUN: 22 mg/dL (ref 7–25)
CO2: 18 mmol/L — AB (ref 20–31)
Calcium: 10.3 mg/dL (ref 8.6–10.4)
Chloride: 102 mmol/L (ref 98–110)
Creat: 0.76 mg/dL (ref 0.50–1.05)
GFR, EST NON AFRICAN AMERICAN: 87 mL/min (ref >=60)
Glucose, Bld: 190 mg/dL — ABNORMAL HIGH (ref 65–99)
Potassium: 4.5 mmol/L (ref 3.5–5.3)
Sodium: 135 mmol/L (ref 135–146)
TOTAL PROTEIN: 7.1 g/dL (ref 6.1–8.1)

## 2016-09-07 MED ORDER — DICLOFENAC SODIUM 1 % TD GEL: 2.0000 g | Freq: Four times a day (QID) | TRANSDERMAL | 11 refills | Status: DC

## 2016-09-07 NOTE — Progress Notes (Signed)
Melanie Nelson is a 56 y.o. female who presents to [facility]: Primary Care Sports Medicine today for follow-up diabetes discussed right arm pain and discuss cervical cancer screening.  Right arm pain: Patient notes a several month history of pain in the right upper arm. She noted that she felt a pulling sensation while lifting heavy boxes several months ago. She notes pain in the proximal bicep and lateral upper arm worse with overhead motion and hand supinated elbow flexed position and at night. She denies any radiating pain weakness or numbness. She feels well otherwise. She's tried some over-the-counter medicines for pain which have helped a little.  Diabetes: Patient feels as though she is doing well. She takes the medication was blown denies any polyuria or polydipsia or hyperglycemic episodes. She thinks her average fasting sugars around 150.  Cervical cancer screening: Patient is due for cervical cancer screening she denies any vaginal discharge or irritation and feels well. Additionally patient would like HIV and hepatitis screening. 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 She feels well and has never noticed any liver pain or elevated liver enzymes on previous blood tests.   Past Medical History:  Diagnosis Date  . Diabetes mellitus without complication (HCC)   . Hyperlipidemia   . Hypertension    Past Surgical History:  Procedure Laterality Date  . ANKLE SURGERY     Social History  Substance Use Topics  . Smoking status: Current Every Day Smoker    Packs/day: 0.50    Years: 20.00  . Smokeless tobacco: Not on file  . Alcohol use 0.0 oz/week     Comment: Drinks 6-7 beers in one day socially every couple weeks   family history includes Alcohol abuse in her father; Aneurysm in her sister; Diabetes  in her mother; Heart disease in her father.  ROS as above:  Medications: Current Outpatient Prescriptions  Medication Sig Dispense Refill  . diclofenac sodium (VOLTAREN) 1 % GEL Apply 2 g topically 4 (four) times daily. To affected joint. 100 g 11  . empagliflozin (JARDIANCE) 25 MG TABS tablet Take 25 mg by mouth daily. 90 tablet 3  . fluticasone (FLONASE) 50 MCG/ACT nasal spray Place 2 sprays into both nostrils daily. 16 g 12  . glucose blood (BAYER CONTOUR NEXT TEST) test strip Test daily. E11.65 100 each 12  . lisinopril (PRINIVIL,ZESTRIL) 20 MG tablet Take 1 tablet (20 mg total) by mouth daily. 90 tablet 3  . metoprolol succinate (TOPROL-XL) 100 MG 24 hr tablet Take 1 tablet (100 mg total) by mouth daily. Take with or immediately following a meal. 90 tablet 3  . rosuvastatin (CRESTOR) 40 MG tablet Take 1 tablet (40 mg total) by mouth daily. 90 tablet 3  . Saxagliptin-Metformin 5-500 MG TB24 Take 1 tablet by mouth daily. 90 tablet 3   No current facility-administered medications for this visit.    Allergies  Allergen Reactions  . Sulfa Antibiotics     Health Maintenance Health Maintenance  Topic Date Due  . Hepatitis C Screening  [birth year]  . OPHTHALMOLOGY EXAM  06/11/1969  . HIV Screening  06/12/1974  . PAP SMEAR  06/11/1980  . INFLUENZA VACCINE  11/09/2016  . HEMOGLOBIN A1C  12/04/2016  . FOOT EXAM  06/06/2017  . MAMMOGRAM  02/09/2018  . COLONOSCOPY  03/26/2019  . PNEUMOCOCCAL POLYSACCHARIDE VACCINE (2) 01/27/2021  . TETANUS/TDAP  01/27/2026     Exam:  BP (!) 144/93 (BP Location: Left Arm, Patient Position: Sitting, Cuff Size: Normal)   Pulse (!) 104   Temp 98.3 F (36.8 C)   Wt 186 lb (84.4 kg)   SpO2 97%   BMI 35.14 kg/m  Gen: Well NAD HEENT: EOMI,  MMM Lungs: Normal work of breathing. CTABL Heart: RRR no MRG Abd: NABS, Soft. Nondistended, Nontender Exts: Brisk capillary refill, warm and well perfused.  Right shoulder normal-appearing nontender. Normal  motion of the patient experienced his pain with abduction. Strength is diminished and painful to abduction and external rotation 4/5 compared to contralateral left side. Internal rotation strength is normal. Positive Hawkins and Neer's test Positive Yergason's and speeds test. Positive empty can test.  GYN: Normal external genitalia. Vaginal canal with no significant discharge normal-appearing cervix nontender.   No results found for this or any previous visit (from the past 72 hour(s)). No results found.    Assessment and Plan: 56 y.o. female with  Right shoulder pain: Concerning for proximal biceps tendinopathy as well as rotator cuff tendinitis. Discussed options. Patient would like to try diclofenac gel as well as home exercise program. Recheck in 4 weeks if not better will proceed with further workup likely including x-ray and injection and referral for formal physical therapy.  Diabetes: Doing reasonably well. We'll check A1c as well as metabolic panel.  Screening: Screening for hepatitis C and B and HIV. Pap smear with HPV and gonorrhea and chlamydia test pending   Orders Placed This Encounter  Procedures  . Hepatitis C antibody  . Hepatitis B surface antibody  . Hepatitis B surface antigen  . HIV antibody  . Hemoglobin A1c  . COMPLETE METABOLIC PANEL WITH GFR   Meds ordered this encounter  Medications  . diclofenac sodium (VOLTAREN) 1 % GEL    Sig: Apply 2 g topically 4 (four) times daily. To affected joint.    Dispense:  100 g    Refill:  11     Discussed warning signs or symptoms. Please see discharge instructions. Patient expresses understanding.  I spent 40 minutes with this patient, greater than 50% was face-to-face time counseling regarding the above diagnosis.

## 2016-09-07 NOTE — Progress Notes (Signed)
Pt is here for pap, and right arm pain

## 2016-09-07 NOTE — Patient Instructions (Signed)
Thank you for coming in today. Get labs soon.  Recheck in 4 - 6 weeks.  Work on shoulder exercises.    Secondary Shoulder Impingement Syndrome Rehab Ask your health care provider which exercises are safe for you. Do exercises exactly as told by your health care provider and adjust them as directed. It is normal to feel mild stretching, pulling, tightness, or discomfort as you do these exercises, but you should stop right away if you feel sudden pain or your pain gets worse. Do not begin these exercises until told by your health care provider. Stretching and range of motion exercise This exercise warms up your muscles and joints and improves the movement and flexibility of your neck and shoulder. This exercise also helps to relieve pain and stiffness. Exercise A: Cervical side bend   1. Using good posture, sit on a stable chair, or stand up. 2. Without moving your shoulders, slowly tilt your left / right ear toward your left / right shoulder until you feel a stretch in your neck muscles. You should be looking straight ahead. 3. Hold for __________ seconds. 4. Slowly return to the starting position. 5. Repeat on your left / right side. Repeat __________ times. Complete this exercise __________ times a day. Strengthening exercises These exercises build strength and endurance in your shoulder. Endurance is the ability to use your muscles for a long time, even after they get tired. Exercise B: Scapular protraction, supine   1. Lie on your back on a firm surface. Hold a __________ weight in your left / right hand. 2. Raise your left / right arm straight into the air so your hand is directly above your shoulder joint. 3. Push the weight into the air so your shoulder lifts off of the surface that you are lying on. Do not move your head, neck, or back. 4. Hold for __________ seconds. 5. Slowly return to the starting position. Let your muscles relax completely before you repeat this exercise. Repeat  __________ times. Complete this exercise __________ times a day. Exercise C: Scapular retraction   1. Sit in a stable chair without armrests, or stand. 2. Secure an exercise band to a stable object in front of you so the band is at shoulder height. 3. Hold one end of the exercise band in each hand. Your palms should face down. 4. Squeeze your shoulder blades together and move your elbows slightly behind you. Do not shrug your shoulders while you do this. 5. Hold for __________ seconds. 6. Slowly return to the starting position. Repeat __________ times. Complete this exercise __________ times a day. Exercise D: Shoulder extension with scapular retraction   1. Sit in a stable chair without armrests, or stand. 2. Secure an exercise band to a stable object in front of you where it is above shoulder height. 3. Hold one end of the exercise band in each hand. 4. Straighten your elbows and lift your hands up to shoulder height. 5. Squeeze your shoulder blades together and pull your hands down to the sides of your thighs. Stop when your hands are straight down by your sides. Do not let your hands go behind your body. 6. Hold for __________ seconds. 7. Slowly return to the starting position. Repeat __________ times. Complete this exercise __________ times a day. Exercise E: Shoulder abduction  1. Sit in a stable chair without armrests, or stand. 2. If directed, hold a __________ weight in your left / right hand. 3. Start with your arms straight down.  Turn your left / right hand so your palm faces in, toward your body. 4. Slowly lift your left / right hand out to your side. Do not lift your hand above shoulder height.  Keep your arms straight.  Avoid shrugging your shoulder while you do this movement. Keep your shoulder blade tucked down toward the middle of your back. 5. Hold for __________ seconds. 6. Slowly lower your arm, and return to the starting position. Repeat __________ times. Complete  this exercise __________ times a day. This information is not intended to replace advice given to you by your health care provider. Make sure you discuss any questions you have with your health care provider. Document Released: 03/28/2005 Document Revised: 12/03/2015 Document Reviewed: 02/28/2015 Elsevier Interactive Patient Education  2017 [employer]   Biceps Tendon Tendinitis (Proximal) and Tenosynovitis Rehab Ask your health care provider which exercises are safe for you. Do exercises exactly as told by your health care provider and adjust them as directed. It is normal to feel mild stretching, pulling, tightness, or discomfort as you do these exercises, but you should stop right away if you feel sudden pain or your pain gets worse.Do not begin these exercises until told by your health care provider. Stretching and range of motion exercises These exercises warm up your muscles and joints and improve the movement and flexibility of your arm and shoulder. These exercises also help to relieve pain and stiffness. Exercise A: Shoulder flexion   1. Stand facing a wall. Put your left / right hand on the wall. 2. Slide your left / right hand up the wall. Stop when you feel a stretch in your shoulder, or when you reach the angle that is recommended by your health care provider.  Use your other hand to help raise your arm, if needed.  As your hand gets higher, you may need to step closer to the wall.  Avoid shrugging your shoulder while you raise your arm. To do this, keep your shoulder blade tucked down toward your spine. 3. Hold for __________ seconds. 4. Slowly return to the starting position. Use your other arm to help, if needed. Repeat __________ times. Complete this exercise __________ times a day. Exercise B: Posterior capsule stretch (  passive horizontal adduction) 1. Sit or stand and pull your left / right elbow across your chest, toward your other shoulder. Stop when you feel a  gentle stretch in the back of your shoulder and upper arm.  Keep your arm at shoulder height.  Keep your arm as close to your body as you comfortably can. 2. Hold for __________ seconds. 3. Slowly return to the starting position. Repeat __________ times. Complete this exercise __________ times a day. Strengthening exercises These exercises build strength and endurance in your arm and shoulder. Endurance is the ability to use your muscles for a long time, even after your muscles get tired. Exercise C: Elbow flexion, supinated  7. Sit on a stable chair without armrests, or stand. 8. If directed, hold a __________ weight in your left / right hand, or hold an exercise band with both hands. Your palms should face up toward the ceiling at the starting position. 9. Bend your left / right elbow and move your hand up toward your shoulder. Keep your other arm straight down, in the starting position. 10. Slowly return to the starting position. Repeat __________ times. Complete this exercise __________ times a day. Exercise D: Scapular protraction, supine  8. Lie on your back on a  firm surface. If directed, hold a __________ weight in your left / right hand. 9. Raise your left / right arm straight into the air so your hand is directly above your shoulder joint. 10. Push the weight into the air so your shoulder lifts off of the surface that you are lying on. Do not move your head, neck, or back. 11. Hold for __________ seconds. 12. Slowly return to the starting position. Let your muscles relax completely before you repeat this exercise. Repeat __________ times. Complete this exercise __________ times a day. Exercise E: Scapular retraction  1. Sit in a stable chair without armrests, or stand. 2. Secure an exercise band to a stable object in front of you so the band is at shoulder height. 3. Hold one end of the exercise band in each hand. 4. Squeeze your shoulder blades together and move your elbows  slightly behind you. Do not shrug your shoulders. 5. Hold for __________ seconds. 6. Slowly return to the starting position. Repeat __________ times. Complete this exercise __________ times a day. This information is not intended to replace advice given to you by your health care provider. Make sure you discuss any questions you have with your health care provider. Document Released: 03/28/2005 Document Revised: 12/03/2015 Document Reviewed: 03/06/2015 Elsevier Interactive Patient Education  2017 [employer].

## 2016-09-08 LAB — HEPATITIS C ANTIBODY: HCV Ab: NEGATIVE

## 2016-09-08 LAB — HEMOGLOBIN A1C
Hgb A1c MFr Bld: 9.4 % — ABNORMAL HIGH (ref <5.7)
MEAN PLASMA GLUCOSE: 223 mg/dL

## 2016-09-08 LAB — CYTOLOGY - PAP
Chlamydia: NEGATIVE
Diagnosis: NEGATIVE
HPV (WINDOPATH): NOT DETECTED
Neisseria Gonorrhea: NEGATIVE

## 2016-09-08 LAB — HIV ANTIBODY (ROUTINE TESTING W REFLEX): HIV 1&2 Ab, 4th Generation: NONREACTIVE

## 2016-09-08 LAB — HEPATITIS B SURFACE ANTIGEN: HEP B S AG: NEGATIVE

## 2016-09-08 LAB — HEPATITIS B SURFACE ANTIBODY, QUANTITATIVE: HEPATITIS B-POST: 286 m[IU]/mL

## 2016-10-10 ENCOUNTER — Ambulatory Visit: Payer: BLUE CROSS/BLUE SHIELD | Admitting: Family Medicine

## 2016-11-30 LAB — HM DIABETES EYE EXAM

## 2016-12-06 ENCOUNTER — Ambulatory Visit (INDEPENDENT_AMBULATORY_CARE_PROVIDER_SITE_OTHER): Payer: BLUE CROSS/BLUE SHIELD

## 2016-12-06 ENCOUNTER — Ambulatory Visit (INDEPENDENT_AMBULATORY_CARE_PROVIDER_SITE_OTHER): Payer: BLUE CROSS/BLUE SHIELD | Admitting: Family Medicine

## 2016-12-06 VITALS — BP 104/43 | HR 100 | Temp 98.4°F

## 2016-12-06 DIAGNOSIS — M24011 Loose body in right shoulder: Secondary | ICD-10-CM

## 2016-12-06 DIAGNOSIS — M25511 Pain in right shoulder: Secondary | ICD-10-CM | POA: Diagnosis not present

## 2016-12-06 DIAGNOSIS — E782 Mixed hyperlipidemia: Secondary | ICD-10-CM

## 2016-12-06 DIAGNOSIS — IMO0001 Reserved for inherently not codable concepts without codable children: Secondary | ICD-10-CM

## 2016-12-06 DIAGNOSIS — E1165 Type 2 diabetes mellitus with hyperglycemia: Secondary | ICD-10-CM

## 2016-12-06 DIAGNOSIS — I10 Essential (primary) hypertension: Secondary | ICD-10-CM

## 2016-12-06 DIAGNOSIS — Z23 Encounter for immunization: Secondary | ICD-10-CM | POA: Diagnosis not present

## 2016-12-06 DIAGNOSIS — E108 Type 1 diabetes mellitus with unspecified complications: Secondary | ICD-10-CM | POA: Insufficient documentation

## 2016-12-06 LAB — POCT GLYCOSYLATED HEMOGLOBIN (HGB A1C): Hemoglobin A1C: 8

## 2016-12-06 MED ORDER — CLORAZEPATE DIPOTASSIUM 3.75 MG PO TABS: 3.7500 mg | ORAL_TABLET | Freq: Every evening | ORAL | 5 refills | Status: DC | PRN

## 2016-12-06 NOTE — Progress Notes (Signed)
Melanie Nelson is a 56 y.o. female who presents to [name]: Primary Care Sports Medicine today for follow up diabetes, HTN, right shoulder pain, and HLD.   Diabetes: Jeanie take Jardiance, Saxagliptin and Metformin.  She has reduced her carbohydrate intake recently. She is fearful of needing insulin. She notes that she has blood sugars ranging from 140s to 170s. She denies any polyuria or polydipsia or hypoglycemic episodes.  Hypertension: Patient takes lisinopril daily. She denies chest pain palpitations shortness of breath lightheadedness or dizziness. She additionally takes metoprolol daily. She notes her blood pressures are typically in the 120s 130s.  Right shoulder pain: Patient is continued right shoulder pains. She notes popping and clicking. The pain is worse with overhead motion and at night. Pain is moderate and interferes with normal exercise.  Lipids: Patient continues to take rosuvastatin daily. She denies significant muscle aches or pain.   Past Medical History:  Diagnosis Date  . Diabetes mellitus without complication (HCC)   . Hyperlipidemia   . Hypertension    Past Surgical History:  Procedure Laterality Date  . ANKLE SURGERY     Social History  Substance Use Topics  . Smoking status: Current Every Day Smoker    Packs/day: 0.50    Years: 20.00  . Smokeless tobacco: Not on file  . Alcohol use 0.0 oz/week     Comment: Drinks 6-7 beers in one day socially every couple weeks   family history includes Alcohol abuse in her father; Aneurysm in her sister; Diabetes in her mother; Heart disease in her father.  ROS as above:  Medications: Current Outpatient Prescriptions  Medication Sig Dispense Refill  . clorazepate (TRANXENE) 3.75 MG tablet Take 1 tablet (3.75 mg total) by mouth at bedtime as needed for anxiety or sleep. 30 tablet 5  . diclofenac sodium (VOLTAREN) 1 % GEL  Apply 2 g topically 4 (four) times daily. To affected joint. 100 g 11  . empagliflozin (JARDIANCE) 25 MG TABS tablet Take 25 mg by mouth daily. 90 tablet 3  . fluticasone (FLONASE) 50 MCG/ACT nasal spray Place 2 sprays into both nostrils daily. 16 g 12  . glucose blood (BAYER CONTOUR NEXT TEST) test strip Test daily. E11.65 100 each 12  . lisinopril (PRINIVIL,ZESTRIL) 20 MG tablet Take 1 tablet (20 mg total) by mouth daily. 90 tablet 3  . metoprolol succinate (TOPROL-XL) 100 MG 24 hr tablet Take 1 tablet (100 mg total) by mouth daily. Take with or immediately following a meal. 90 tablet 3  . rosuvastatin (CRESTOR) 40 MG tablet Take 1 tablet (40 mg total) by mouth daily. 90 tablet 3  . Saxagliptin-Metformin 5-500 MG TB24 Take 1 tablet by mouth daily. 90 tablet 3   No current facility-administered medications for this visit.    Allergies  Allergen Reactions  . Sulfa Antibiotics     Health Maintenance Health Maintenance  Topic Date Due  . OPHTHALMOLOGY EXAM  06/11/1969  . INFLUENZA VACCINE  11/09/2016  . HEMOGLOBIN A1C  03/10/2017  . FOOT EXAM  06/06/2017  . MAMMOGRAM  02/09/2018  . COLONOSCOPY  03/26/2019  . PNEUMOCOCCAL POLYSACCHARIDE VACCINE (2) 01/27/2021  . PAP SMEAR  09/07/2021  . TETANUS/TDAP  01/27/2026  . Hepatitis C Screening  Completed  . HIV Screening  Completed     Exam:  BP (!) 104/43 (BP Location: Left Arm, Patient Position: Sitting, Cuff Size: Large)   Pulse 100   Temp 98.4 F (36.9 C) (Oral)  SpO2 99%  Gen: Well NAD HEENT: EOMI,  MMM Lungs: Normal work of breathing. CTABL Heart: RRR no MRG Abd: NABS, Soft. Nondistended, Nontender Exts: Brisk capillary refill, warm and well perfused.  Right shoulder normal-appearing tender palpation acromioclavicular joint. Normal motion pain with abduction. Internal rotation limited to lumbar spine. Positive Hawkins and Neer's test. Negative Yergason's and speeds test. Strength is diminished 4/5 abduction and external  motion. Positive empty can test.  Procedure: Real-time Ultrasound Guided Injection of right subacromial space  Device: GE Logiq E  Images permanently stored and available for review in the ultrasound unit. Verbal informed consent obtained. Discussed risks and benefits of procedure. Warned about infection bleeding damage to structures skin hypopigmentation and fat atrophy among others. Patient expresses understanding and agreement Time-out conducted.  Noted no overlying erythema, induration, or other signs of local infection.  Skin prepped in a sterile fashion.  Local anesthesia: Topical Ethyl chloride.  With sterile technique and under real time ultrasound guidance: 40mg  kenalog and 27ml marcaine injected easily.  Completed without difficulty  Pain immediately resolved suggesting accurate placement of the medication.  Advised to call if fevers/chills, erythema, induration, drainage, or persistent bleeding.  Images permanently stored and available for review in the ultrasound unit.  Impression: Technically successful ultrasound guided injection.     Results for orders placed or performed in visit on 12/06/16 (from the past 72 hour(s))  POCT HgB A1C     Status: None   Collection Time: 12/06/16  8:24 AM  Result Value Ref Range   Hemoglobin A1C 8.0    Dg Shoulder Right  Result Date: 12/06/2016 CLINICAL DATA:  Right shoulder pain.  No injury. EXAM: RIGHT SHOULDER - 2+ VIEW COMPARISON:  No recent prior . FINDINGS: No acute bony or joint abnormality identified. Acromioclavicular glenohumeral degenerative change. Prominent loose bodies noted. No acute abnormality. No evidence of fracture or dislocation. IMPRESSION: Acromioclavicular and glenohumeral degenerative change. Prominent loose bodies. No acute abnormality. Electronically Signed   By: Maisie Fus  Register   On: 12/06/2016 09:34      Assessment and Plan: 56 y.o. female with  Right shoulder pain likely due to subacromial  bursitis. She does have glenohumeral degenerative changes and loose bodies as well. I think these are less likely to be the cause of pain as she had good resolution of symptoms with injection of the subacromial space. Plan to work on home exercise program and recheck in 3 months.  Diabetes improved A1c today. Patient declines insulin. Plan to continue to work on decreasing carbohydrate diet. Recheck in 3 months. Next  Hypertension: Blood pressure is a bit low today however patient is completely asymptomatic. Typically she is a bit hypertensive. Plan for watchful waiting and recheck in 3 months.  Hyperlipidemia doing well with rosuvastatin. Continue to follow.  Flu vaccine given prior to discharge.   Orders Placed This Encounter  Procedures  . DG Shoulder Right    Standing Status:   Future    Number of Occurrences:   1    Standing Expiration Date:   02/05/2018    Order Specific Question:   Reason for Exam (SYMPTOM  OR DIAGNOSIS REQUIRED)    Answer:   eval pain    Order Specific Question:   Is patient pregnant?    Answer:   No    Order Specific Question:   Preferred imaging location?    Answer:   [facility]    Order Specific Question:   Radiology Contrast Protocol - do NOT remove  file path    Answer:   [email or web address]  . Flu Vaccine QUAD 6+ mos PF IM (Fluarix Quad PF)  . POCT HgB A1C   Meds ordered this encounter  Medications  . clorazepate (TRANXENE) 3.75 MG tablet    Sig: Take 1 tablet (3.75 mg total) by mouth at bedtime as needed for anxiety or sleep.    Dispense:  30 tablet    Refill:  5     Discussed warning signs or symptoms. Please see discharge instructions. Patient expresses understanding.

## 2016-12-06 NOTE — Patient Instructions (Addendum)
Thank you for coming in today. Call or go to the ER if you develop a large red swollen joint with extreme pain or oozing puss.  Recheck in 3 months.  Continue exercises.   For diabetes work on reduced carbs.  This will avoid insulin.  Try to get less than 45g of carbs per meal.      Secondary Shoulder Impingement Syndrome Rehab Ask your health care provider which exercises are safe for you. Do exercises exactly as told by your health care provider and adjust them as directed. It is normal to feel mild stretching, pulling, tightness, or discomfort as you do these exercises, but you should stop right away if you feel sudden pain or your pain gets worse. Do not begin these exercises until told by your health care provider. Stretching and range of motion exercise This exercise warms up your muscles and joints and improves the movement and flexibility of your neck and shoulder. This exercise also helps to relieve pain and stiffness. Exercise A: Cervical side bend  1. Using good posture, sit on a stable chair, or stand up. 2. Without moving your shoulders, slowly tilt your left / right ear toward your left / right shoulder until you feel a stretch in your neck muscles. You should be looking straight ahead. 3. Hold for __________ seconds. 4. Slowly return to the starting position. 5. Repeat on your left / right side. Repeat __________ times. Complete this exercise __________ times a day. Strengthening exercises These exercises build strength and endurance in your shoulder. Endurance is the ability to use your muscles for a long time, even after they get tired. Exercise B: Scapular protraction, supine  1. Lie on your back on a firm surface. Hold a __________ weight in your left / right hand. 2. Raise your left / right arm straight into the air so your hand is directly above your shoulder joint. 3. Push the weight into the air so your shoulder lifts off of the surface that you are lying on. Do  not move your head, neck, or back. 4. Hold for __________ seconds. 5. Slowly return to the starting position. Let your muscles relax completely before you repeat this exercise. Repeat __________ times. Complete this exercise __________ times a day. Exercise C: Scapular retraction  1. Sit in a stable chair without armrests, or stand. 2. Secure an exercise band to a stable object in front of you so the band is at shoulder height. 3. Hold one end of the exercise band in each hand. Your palms should face down. 4. Squeeze your shoulder blades together and move your elbows slightly behind you. Do not shrug your shoulders while you do this. 5. Hold for __________ seconds. 6. Slowly return to the starting position. Repeat __________ times. Complete this exercise __________ times a day. Exercise D: Shoulder extension with scapular retraction  1. Sit in a stable chair without armrests, or stand. 2. Secure an exercise band to a stable object in front of you where it is above shoulder height. 3. Hold one end of the exercise band in each hand. 4. Straighten your elbows and lift your hands up to shoulder height. 5. Squeeze your shoulder blades together and pull your hands down to the sides of your thighs. Stop when your hands are straight down by your sides. Do not let your hands go behind your body. 6. Hold for __________ seconds. 7. Slowly return to the starting position. Repeat __________ times. Complete this exercise __________ times a day.  Exercise E: Shoulder abduction 1. Sit in a stable chair without armrests, or stand. 2. If directed, hold a __________ weight in your left / right hand. 3. Start with your arms straight down. Turn your left / right hand so your palm faces in, toward your body. 4. Slowly lift your left / right hand out to your side. Do not lift your hand above shoulder height. ? Keep your arms straight. ? Avoid shrugging your shoulder while you do this movement. Keep your  shoulder blade tucked down toward the middle of your back. 5. Hold for __________ seconds. 6. Slowly lower your arm, and return to the starting position. Repeat __________ times. Complete this exercise __________ times a day. This information is not intended to replace advice given to you by your health care provider. Make sure you discuss any questions you have with your health care provider. Document Released: 03/28/2005 Document Revised: 12/03/2015 Document Reviewed: 02/28/2015 Elsevier Interactive Patient Education  [employer].

## 2016-12-14 ENCOUNTER — Encounter: Payer: Self-pay | Admitting: Family Medicine

## 2016-12-26 ENCOUNTER — Telehealth: Payer: Self-pay

## 2016-12-26 MED ORDER — FLUCONAZOLE 150 MG PO TABS: 150.0000 mg | ORAL_TABLET | Freq: Once | ORAL | 1 refills | Status: AC

## 2016-12-26 NOTE — Telephone Encounter (Signed)
Fluconazole sent to [facility]

## 2016-12-26 NOTE — Telephone Encounter (Signed)
Pt informed. Pt expressed understanding and is agreeable. Brittni Hult CMA, RT 

## 2016-12-26 NOTE — Telephone Encounter (Signed)
Pt called and stated that she thinks she has a yeast infection. Pt states she has had bad itching, white  discharge, and burning pain, in her vaginal area for 5 days. Pt wants to know if she can get an antifungal medicine called into her pharmacy for her. Please advise?

## 2017-01-15 ENCOUNTER — Other Ambulatory Visit: Payer: Self-pay | Admitting: Family Medicine

## 2017-01-15 DIAGNOSIS — IMO0001 Reserved for inherently not codable concepts without codable children: Secondary | ICD-10-CM

## 2017-01-15 DIAGNOSIS — E1165 Type 2 diabetes mellitus with hyperglycemia: Principal | ICD-10-CM

## 2017-01-31 ENCOUNTER — Other Ambulatory Visit: Payer: Self-pay | Admitting: Family Medicine

## 2017-01-31 DIAGNOSIS — I10 Essential (primary) hypertension: Secondary | ICD-10-CM

## 2017-01-31 DIAGNOSIS — E782 Mixed hyperlipidemia: Secondary | ICD-10-CM

## 2017-03-03 ENCOUNTER — Other Ambulatory Visit: Payer: Self-pay | Admitting: Family Medicine

## 2017-03-03 DIAGNOSIS — I10 Essential (primary) hypertension: Secondary | ICD-10-CM

## 2017-03-03 DIAGNOSIS — E782 Mixed hyperlipidemia: Secondary | ICD-10-CM

## 2017-03-08 ENCOUNTER — Encounter: Payer: Self-pay | Admitting: Family Medicine

## 2017-03-08 ENCOUNTER — Ambulatory Visit: Payer: BLUE CROSS/BLUE SHIELD | Admitting: Family Medicine

## 2017-03-08 VITALS — BP 142/81 | HR 98 | Ht 61.0 in | Wt 178.0 lb

## 2017-03-08 DIAGNOSIS — M25511 Pain in right shoulder: Secondary | ICD-10-CM | POA: Diagnosis not present

## 2017-03-08 DIAGNOSIS — E11649 Type 2 diabetes mellitus with hypoglycemia without coma: Secondary | ICD-10-CM | POA: Diagnosis not present

## 2017-03-08 DIAGNOSIS — L900 Lichen sclerosus et atrophicus: Secondary | ICD-10-CM

## 2017-03-08 DIAGNOSIS — N952 Postmenopausal atrophic vaginitis: Secondary | ICD-10-CM | POA: Diagnosis not present

## 2017-03-08 LAB — POCT GLYCOSYLATED HEMOGLOBIN (HGB A1C): HEMOGLOBIN A1C: 8.1

## 2017-03-08 MED ORDER — ESTROGENS, CONJUGATED 0.625 MG/GM VA CREA: 1.0000 | TOPICAL_CREAM | Freq: Every day | VAGINAL | 12 refills | Status: DC

## 2017-03-08 MED ORDER — CLOBETASOL PROPIONATE 0.05 % EX OINT: 1.0000 "application " | TOPICAL_OINTMENT | Freq: Two times a day (BID) | CUTANEOUS | 1 refills | Status: DC

## 2017-03-08 NOTE — Progress Notes (Addendum)
Melanie Nelson is a 56 y.o. female who presents to [name]: Primary Care Sports Medicine today for follow up diabetes, right shoulder pain and discuss vaginal itching.   Right shoulder pain: Ongoing now for several months.  Patient had a steroid injection in August which provided immediate pain relief that lasted about a week.  She has been doing her home exercise programs but notes continued pain especially with overhead motion.  She denies any fevers or chills vomiting or diarrhea the pain is quite severe worse with motion and better with rest.  She does have pain also at bedtime.  Diabetes: Ellanore continues to take the diabetes medications listed below.  She notes blood sugars typically in the 150s.  She denies hyperglycemic or hypoglycemic episodes.  She notes that she is been trying to eat a better diet but has not done very well with her diet over the past month or 2.  Vaginal itching: Melanie Nelson notes significant irritating vaginal itching.  This is been ongoing for months.  She has had a empiric trial of fluconazole which has not helped.  Additionally she is tried over-the-counter yeast medication which have not helped.  She denies any urinary symptoms fevers or chills.   Past Medical History:  Diagnosis Date  . Diabetes mellitus without complication (HCC)   . Hyperlipidemia   . Hypertension    Past Surgical History:  Procedure Laterality Date  . ANKLE SURGERY     Social History   Tobacco Use  . Smoking status: Current Every Day Smoker    Packs/day: 0.50    Years: 20.00    Pack years: 10.00  . Smokeless tobacco: Never Used  Substance Use Topics  . Alcohol use: Yes    Alcohol/week: 0.0 oz    Comment: Drinks 6-7 beers in one day socially every couple weeks   family history includes Alcohol abuse in her father; Aneurysm in her sister; Diabetes in her mother; Heart disease in her  father.  ROS as above:  Medications: Current Outpatient Medications  Medication Sig Dispense Refill  . clorazepate (TRANXENE) 3.75 MG tablet Take 1 tablet (3.75 mg total) by mouth at bedtime as needed for anxiety or sleep. 30 tablet 5  . diclofenac sodium (VOLTAREN) 1 % GEL Apply 2 g topically 4 (four) times daily. To affected joint. 100 g 11  . fluticasone (FLONASE) 50 MCG/ACT nasal spray Place 2 sprays into both nostrils daily. 16 g 12  . glucose blood (BAYER CONTOUR NEXT TEST) test strip Test daily. E11.65 100 each 12  . JARDIANCE 25 MG TABS tablet TAKE ONE TABLET BY MOUTH ONCE DAILY 90 tablet 3  . lisinopril (PRINIVIL,ZESTRIL) 20 MG tablet TAKE 1 TABLET BY MOUTH ONCE DAILY 30 tablet 0  . metoprolol succinate (TOPROL-XL) 100 MG 24 hr tablet Take 1 tablet (100 mg total) by mouth daily. Take with or immediately following a meal. 90 tablet 3  . rosuvastatin (CRESTOR) 40 MG tablet TAKE 1 TABLET BY MOUTH ONCE DAILY DUE  FOR  LAB  WORK 30 tablet 0  . Saxagliptin-Metformin 5-500 MG TB24 Take 1 tablet by mouth daily. 90 tablet 3  . clobetasol ointment (TEMOVATE) 0.05 % Apply 1 application topically 2 (two) times daily. 60 g 1  . conjugated estrogens (PREMARIN) vaginal cream Place 1 Applicatorful vaginally daily. 42.5 g 12   No current facility-administered medications for this visit.    Allergies  Allergen Reactions  . Sulfa Antibiotics  Health Maintenance Health Maintenance  Topic Date Due  . FOOT EXAM  06/06/2017  . HEMOGLOBIN A1C  06/08/2017  . OPHTHALMOLOGY EXAM  11/30/2017  . MAMMOGRAM  02/09/2018  . COLONOSCOPY  03/26/2019  . PNEUMOCOCCAL POLYSACCHARIDE VACCINE (2) 01/27/2021  . PAP SMEAR  09/07/2021  . TETANUS/TDAP  01/27/2026  . INFLUENZA VACCINE  Completed  . Hepatitis C Screening  Completed  . HIV Screening  Completed     Exam:  BP (!) 142/81   Pulse 98   Ht 5\' 1"  (1.549 m)   Wt 178 lb (80.7 kg)   BMI 33.63 kg/m  Gen: Well NAD HEENT: EOMI,  MMM Lungs:  Normal work of breathing. CTABL Heart: RRR no MRG Abd: NABS, Soft. Nondistended, Nontender Exts: Brisk capillary refill, warm and well perfused.  MSK: Right shoulder normal-appearing nontender Range of motion is intact but pain with abduction.  Positive Hawkins and Neer's test.  Strength is intact. GYN: External genitalia significant for vaginal atrophy with erythema around the labia minora into the clitoris.  No significant skin changes otherwise.   Results for orders placed or performed in visit on 03/08/17 (from the past 72 hour(s))  POCT HgB A1C     Status: Abnormal   Collection Time: 03/08/17  7:15 AM  Result Value Ref Range   Hemoglobin A1C 8.1    No results found.    Assessment and Plan: 56 y.o. female with  Right shoulder pain: Concerning for rotator cuff tendinopathy.  Plan for MRI for further planning and potential surgical planning.  Recheck after MRI.  Diabetes: About the same as it was 3 months ago.  Work on improved diet and exercise.  If not better will add further medications.  Vaginal itching: Very likely lichen sclerosis combined with vaginal atrophy.  Plan to treat with clobetasol and vaginal estrogen cream.  Recheck in a few weeks.  If not better will proceed with referral to OBGYN.   Addendum after doing some research and discussion with an OB/GYN colleague after the patient went home: 10:08 AM 03/08/17  Possibly lichen sclerosis or lichen planus with in the setting of vaginal atrophy.  Although I think it is reasonable to proceed with a vaginal biopsy for starting treatment with high intensity steroids.  Plan to prescribe permethrin cream for vaginal atrophy and refer to OB/GYN I have prescribed clobetasol to use if the diagnosis is confirmed.  I have called the patient to discuss the change in plans.   Orders Placed This Encounter  Procedures  . MR Shoulder Right Wo Contrast    Standing Status:   Future    Standing Expiration Date:   05/08/2018    Order  Specific Question:   What is the patient's sedation requirement?    Answer:   Anti-anxiety    Order Specific Question:   Does the patient have a pacemaker or implanted devices?    Answer:   No    Order Specific Question:   [facility]?    [facility]    Order Specific Question:   Radiology Contrast Protocol - do NOT remove file path    Answer:   [email or web address]  . Ambulatory referral to Obstetrics / Gynecology    Referral Priority:   Routine    Referral Type:   Consultation    Referral Reason:   Specialty Services Required    Requested Specialty:   Obstetrics and Gynecology    Number of Visits Requested:   1  .  POCT HgB A1C   Meds ordered this encounter  Medications  . clobetasol ointment (TEMOVATE) 0.05 %    Sig: Apply 1 application topically 2 (two) times daily.    Dispense:  60 g    Refill:  1  . conjugated estrogens (PREMARIN) vaginal cream    Sig: Place 1 Applicatorful vaginally daily.    Dispense:  42.5 g    Refill:  12     Discussed warning signs or symptoms. Please see discharge instructions. Patient expresses understanding.

## 2017-03-08 NOTE — Addendum Note (Signed)
Addended by: Rodolph BongOREY, Jared Whorley S on: 03/08/2017 10:09 AM   Modules accepted: Orders

## 2017-03-08 NOTE — Patient Instructions (Signed)
Thank you for coming in today. For shoulder you should hear about MRI soon.  Please follow up with me a few days after the MRI.  For diabetes work on improved diet and less carbs.  For vaginal itching I think you have vaginal atrophy and lichen sclerosis.  We will recheck in about 1 month when you come back from MRI.    Lichen Sclerosus Lichen sclerosus is a skin problem. It can happen on any part of the body, but it commonly involves the anal or genital areas. It can cause itching and discomfort in these areas. Treatment can help to control symptoms. When the genital area is affected, getting treatment is important because the condition can cause scarring that may lead to other problems. What are the causes? The cause of this condition is not known. It could be the result of an overactive immune system or a lack of certain hormones. Lichen sclerosus is not an infection or a fungus. It is not passed from one person to another (not contagious). What increases the risk? This condition is more likely to develop in women, usually after menopause. What are the signs or symptoms? Symptoms of this condition include:  Thin, wrinkled, white areas on the skin.  Thickened white areas on the skin.  Red and swollen patches (lesions) on the skin.  Tears or cracks in the skin.  Bruising.  Blood blisters.  Severe itching.  You may also have pain, itching, or burning with urination. Constipation is also common in people with lichen sclerosus. How is this diagnosed? This condition may be diagnosed with a physical exam. In some cases, a tissue sample (biopsy sample) may be removed to be looked at under a microscope. How is this treated? This condition is usually treated with medicated creams or ointments (topical steroids) that are applied over the affected areas. Follow these instructions at home:  Take over-the-counter and prescription medicines only as told by your health care provider.  Use  creams or ointments as told by your health care provider.  Do not scratch the affected areas of skin.  Women should keep the vaginal area as clean and dry as possible.  Keep all follow-up visits as told by your health care provider. This is important. Contact a health care provider if:  You have increasing redness, swelling, or pain in the affected area.  You have fluid, blood, or pus coming from the affected area.  You have new lesions on your skin.  You have a fever.  You have pain during sex. This information is not intended to replace advice given to you by your health care provider. Make sure you discuss any questions you have with your health care provider. Document Released: 08/18/2010 Document Revised: 09/03/2015 Document Reviewed: 06/23/2014 Elsevier Interactive Patient Education  2018 [employer]  Atrophic Vaginitis Atrophic vaginitis is a condition in which the tissues that line the vagina become dry and thin. This condition is most common in women who have stopped having regular menstrual periods (menopause). This usually starts when a woman is 2445-57 years old. Estrogen helps to keep the vagina moist. It stimulates the vagina to produce a clear fluid that lubricates the vagina for sexual intercourse. This fluid also protects the vagina from infection. Lack of estrogen can cause the lining of the vagina to get thinner and dryer. The vagina may also shrink in size. It may become less elastic. Atrophic vaginitis tends to get worse over time as a woman's estrogen level drops. What are  the causes? This condition is caused by the normal drop in estrogen that happens around the time of menopause. What increases the risk? Certain conditions or situations may lower a woman's estrogen level, which increases her risk of atrophic vaginitis. These include:  Taking medicine that blocks estrogen.  Having ovaries removed surgically.  Being treated for cancer with X-ray treatment  (radiation) or medicines (chemotherapy).  Exercising very hard and often.  Having an eating disorder (anorexia).  Giving birth or breastfeeding.  Being over the age of 57.  Smoking.  What are the signs or symptoms? Symptoms of this condition include:  Pain, soreness, or bleeding during sexual intercourse (dyspareunia).  Vaginal burning, irritation, or itching.  Pain or bleeding during a vaginal examination using a speculum (pelvic exam).  Loss of interest in sexual activity.  Having burning pain when passing urine.  Vaginal discharge that is brown or yellow.  In some cases, there are no symptoms. How is this diagnosed? This condition is diagnosed with a medical history and physical exam. This will include a pelvic exam that checks whether the inside of your vagina appears pale, thin, or dry. Rarely, you may also have other tests, including:  A urine test.  A test that checks the acid balance in your vaginal fluid (acid balance test).  How is this treated? Treatment for this condition may depend on the severity of your symptoms. Treatment may include:  Using an over-the-counter vaginal lubricant before you have sexual intercourse.  Using a long-acting vaginal moisturizer.  Using low-dose vaginal estrogen for moderate to severe symptoms that do not respond to other treatments. Options include creams, tablets, and inserts (vaginal rings). Before using vaginal estrogen, tell your health care provider if you have a history of: ? Breast cancer. ? Endometrial cancer. ? Blood clots.  Taking medicines. You may be able to take a daily pill for dyspareunia. Discuss all of the risks of this medicine with your health care provider. It is usually not recommended for women who have a family history or personal history of breast cancer.  If your symptoms are very mild and you are not sexually active, you may not need treatment. Follow these instructions at home:  Take medicines  only as directed by your health care provider. Do not use herbal or alternative medicines unless your health care provider says that you can.  Use over-the-counter creams, lubricants, or moisturizers for dryness only as directed by your health care provider.  If your atrophic vaginitis is caused by menopause, discuss all of your menopausal symptoms and treatment options with your health care provider.  Do not douche.  Do not use products that can make your vagina dry. These include: ? Scented feminine sprays. ? Scented tampons. ? Scented soaps.  If it hurts to have sex, talk with your sexual partner. Contact a health care provider if:  Your discharge looks different than normal.  Your vagina has an unusual smell.  You have new symptoms.  Your symptoms do not improve with treatment.  Your symptoms get worse. This information is not intended to replace advice given to you by your health care provider. Make sure you discuss any questions you have with your health care provider. Document Released: 08/12/2014 Document Revised: 09/03/2015 Document Reviewed: 03/19/2014 Elsevier Interactive Patient Education  [employer]2018 [employer]

## 2017-03-15 ENCOUNTER — Ambulatory Visit: Payer: BLUE CROSS/BLUE SHIELD | Admitting: Obstetrics & Gynecology

## 2017-03-15 ENCOUNTER — Encounter: Payer: Self-pay | Admitting: Obstetrics & Gynecology

## 2017-03-15 VITALS — BP 131/74 | HR 101 | Resp 16 | Ht 61.0 in | Wt 181.0 lb

## 2017-03-15 DIAGNOSIS — N898 Other specified noninflammatory disorders of vagina: Secondary | ICD-10-CM | POA: Diagnosis not present

## 2017-03-15 DIAGNOSIS — L680 Hirsutism: Secondary | ICD-10-CM | POA: Diagnosis not present

## 2017-03-15 NOTE — Progress Notes (Signed)
Patient ID: Melanie Nelson Marucci, female   DOB: 09/24/1960, 56 y.o.   MRN: [id number]  Chief Complaint  Patient presents with  . Lichen Sclerosus    HPI Melanie Nelson Dufford is a 56 y.o. female.  Married diabetic with a HBA1C of more than 8 recently comes in with extreme vulvar itching. She tried an Veterinary surgeonTC yeast medicine but did not experience relief. She has changed her laundry and soaps to hypoallergenic versions. Has tried [employer] with no help. HPI  Past Medical History:  Diagnosis Date  . Diabetes mellitus without complication (HCC)   . Hyperlipidemia   . Hypertension     Past Surgical History:  Procedure Laterality Date  . ANKLE SURGERY      Family History  Problem Relation Age of Onset  . Diabetes Mother   . Alcohol abuse Father   . Heart disease Father   . Aneurysm Sister     Social History Social History   Tobacco Use  . Smoking status: Current Every Day Smoker    Packs/day: 0.50    Years: 20.00    Pack years: 10.00  . Smokeless tobacco: Never Used  Substance Use Topics  . Alcohol use: Yes    Alcohol/week: 0.0 oz    Comment: Drinks 6-7 beers in one day socially every couple weeks  . Drug use: No    Allergies  Allergen Reactions  . Sulfa Antibiotics     Current Outpatient Medications  Medication Sig Dispense Refill  . aspirin EC 81 MG tablet Take 81 mg by mouth daily.    . clorazepate (TRANXENE) 3.75 MG tablet Take 1 tablet (3.75 mg total) by mouth at bedtime as needed for anxiety or sleep. 30 tablet 5  . fluticasone (FLONASE) 50 MCG/ACT nasal spray Place 2 sprays into both nostrils daily. 16 g 12  . glucose blood (BAYER CONTOUR NEXT TEST) test strip Test daily. E11.65 100 each 12  . JARDIANCE 25 MG TABS tablet TAKE ONE TABLET BY MOUTH ONCE DAILY 90 tablet 3  . lisinopril (PRINIVIL,ZESTRIL) 20 MG tablet TAKE 1 TABLET BY MOUTH ONCE DAILY 30 tablet 0  . metoprolol succinate (TOPROL-XL) 100 MG 24 hr tablet Take 1 tablet (100 mg total) by mouth daily. Take with or  immediately following a meal. 90 tablet 3  . rosuvastatin (CRESTOR) 40 MG tablet TAKE 1 TABLET BY MOUTH ONCE DAILY DUE  FOR  LAB  WORK 30 tablet 0  . Saxagliptin-Metformin 5-500 MG TB24 Take 1 tablet by mouth daily. 90 tablet 3  . clobetasol ointment (TEMOVATE) 0.05 % Apply 1 application topically 2 (two) times daily. (Patient not taking: Reported on 03/15/2017) 60 g 1  . conjugated estrogens (PREMARIN) vaginal cream Place 1 Applicatorful vaginally daily. (Patient not taking: Reported on 03/15/2017) 42.5 g 12  . diclofenac sodium (VOLTAREN) 1 % GEL Apply 2 g topically 4 (four) times daily. To affected joint. (Patient not taking: Reported on 03/15/2017) 100 g 11  . fluconazole (DIFLUCAN) 150 MG tablet   0   No current facility-administered medications for this visit.     Review of Systems Review of Systems She has noteable hirsuitism, female pattern baldness, and was menopausal since her late 4440s.  Blood pressure 131/74, pulse (!) 101, resp. rate 16, height 5\' 1"  (1.549 m), weight 181 lb (82.1 kg).  Physical Exam Physical Exam Ple[place] hirsuite female Abd- benign Vulva - extreme erthema c/w yeast, some excoriation on her perineum I biopsied the perineum with a 4 mm punch after  lidocaine and betadine. She tolerated the procedure well. Data Reviewed   Assessment    Vulvar itching and erythema- I suspect yeast Will treat with diflucan, refills given Will add compounded vaginal estrogen QOD  Await biopsy results  Draw test panel  Plan    See above       Allie BossierMyra C Jadalyn Oliveri 03/15/2017, 9:37 AM

## 2017-03-16 MED ORDER — FLUCONAZOLE 150 MG PO TABS: 150.0000 mg | ORAL_TABLET | Freq: Once | ORAL | 0 refills | Status: AC

## 2017-03-16 NOTE — Addendum Note (Signed)
Addended by: Kathie DikeSOLA, Jerrian Mells J on: 03/16/2017 01:14 PM   Modules accepted: Orders

## 2017-03-19 LAB — TESTOSTERONE, FREE: TESTOSTERONE FREE: 4.4 pg/mL (ref 0.2–5.0)

## 2017-03-19 LAB — TESTOSTERONE: Testosterone: 21 ng/dL

## 2017-03-19 LAB — SEX HORMONE BINDING GLOBULIN: Sex Hormone Binding: 13 nmol/L — ABNORMAL LOW (ref 14–73)

## 2017-03-20 ENCOUNTER — Other Ambulatory Visit: Payer: BLUE CROSS/BLUE SHIELD

## 2017-04-03 ENCOUNTER — Ambulatory Visit (INDEPENDENT_AMBULATORY_CARE_PROVIDER_SITE_OTHER): Payer: BLUE CROSS/BLUE SHIELD

## 2017-04-03 ENCOUNTER — Other Ambulatory Visit: Payer: Self-pay | Admitting: Family Medicine

## 2017-04-03 DIAGNOSIS — E782 Mixed hyperlipidemia: Secondary | ICD-10-CM

## 2017-04-03 DIAGNOSIS — I10 Essential (primary) hypertension: Secondary | ICD-10-CM

## 2017-04-03 DIAGNOSIS — M7551 Bursitis of right shoulder: Secondary | ICD-10-CM

## 2017-04-03 DIAGNOSIS — M25511 Pain in right shoulder: Secondary | ICD-10-CM

## 2017-04-06 ENCOUNTER — Ambulatory Visit: Payer: BLUE CROSS/BLUE SHIELD | Admitting: Family Medicine

## 2017-04-06 ENCOUNTER — Encounter: Payer: Self-pay | Admitting: Family Medicine

## 2017-04-06 VITALS — BP 135/81 | HR 97 | Wt 182.0 lb

## 2017-04-06 DIAGNOSIS — M25511 Pain in right shoulder: Secondary | ICD-10-CM

## 2017-04-06 NOTE — Patient Instructions (Addendum)
Thank you for coming in today. You should hear from Dr Ave Filterhandler soon.  Let me know if you do not hear anything.   Recheck March 1st or later for diabates.    Reverse Total Shoulder Replacement, Care After This sheet gives you information about how to care for yourself after your procedure. Your health care provider may also give you more specific instructions. If you have problems or questions, contact your health care provider. What can I expect after the procedure? After the procedure, it is common to have:  Pain.  Stiffness.  Follow these instructions at home: If you have a sling:  Wear the sling as told by your health care provider. Remove it only as told by your health care provider.  Loosen the sling if your fingers tingle, become numb, or turn cold and blue.  Keep the sling clean.  If the sling is not waterproof, do not let it get wet. Bathing  Do not take baths, swim, or use a hot tub until your health care provider approves. Ask your health care provider if you may take showers. You may only be allowed to take sponge baths for bathing.  If your sling is not waterproof, cover it with a watertight covering when you take a bath or shower.  Keep your bandage (dressing) dry until your health care provider says it can be removed. Incision care   Follow instructions from your health care provider about how to take care of your incision. Make sure you: ? Wash your hands with soap and water before you change your bandage (dressing). If soap and water are not available, use hand sanitizer. ? Change your dressing as told by your health care provider. ? Leave stitches (sutures), skin glue, or adhesive strips in place. These skin closures may need to stay in place for 2 weeks or longer. If adhesive strip edges start to loosen and curl up, you may trim the loose edges. Do not remove adhesive strips completely unless your health care provider tells you to do that.  Check your  incision every day for signs of infection. Check for: ? More redness, swelling, or pain. ? More fluid or blood. ? Warmth. ? Pus or a bad smell. Driving  Ask your health care provider when it is safe for you to drive.  Do not drive or use heavy machinery while taking prescription pain medicine.  Do not drive for 24 hours if you were given a medicine to help you relax (sedative). Activity  Return to your normal activities as told by your health care provider. Ask your health care provider what activities are safe for you.  Do shoulder exercises as told by your health care provider.  Do not lift your arm above shoulder level until your health care provider approves.  Do not make large arm movements.  Do not push or pull things until your health care provider approves.  Do not lift anything that is heavier than 5 lbs (2.3 kg) until your health care provider approves. Managing pain, stiffness, and swelling   If directed, put ice on your shoulder. ? Put ice in a plastic bag. ? Place a towel between your skin and the bag. ? Leave the ice on for 20 minutes, 2-3 times a day.  Move your fingers and hand often to avoid stiffness and to lessen swelling. General instructions  Do not use any products that contain nicotine or tobacco, such as cigarettes and e-cigarettes. These can delay bone healing. If you  need help quitting, ask your health care provider.  To prevent or treat constipation while you are taking prescription pain medicine, your health care provider may recommend that you: ? Drink enough fluid to keep your urine clear or pale yellow. ? Take over-the-counter or prescription medicines. ? Eat foods that are high in fiber, such as fresh fruits and vegetables, whole grains, and beans. ? Limit foods that are high in fat and processed sugars, such as fried and sweet foods.  Take over-the-counter and prescription medicines only as told by your health care provider.  Keep all  follow-up visits as told by your health care provider. This is important. Contact a health care provider if:  You feel nauseous or you vomit.  You are constipated. Constipation is when you have: ? Fewer bowel movements in a week than normal. ? Difficulty having a bowel movement. ? Stools that are dry, hard, or larger than normal.  Your arm tingles or feels numb.  Your pain gets worse, even after taking pain medicine.  You have more redness, swelling, or pain around your incision.  You have more fluid or blood coming from your incision.  Your incision feels warm to the touch.  You have pus or a bad smell coming from your incision.  You have a fever. Get help right away if:  Your shoulder joint moves out of place.  Your incision comes apart. This information is not intended to replace advice given to you by your health care provider. Make sure you discuss any questions you have with your health care provider. Document Released: 09/29/2015 Document Revised: 10/16/2015 Document Reviewed: 09/29/2015 Elsevier Interactive Patient Education  [employer]2018 [employer]

## 2017-04-06 NOTE — Progress Notes (Signed)
Melanie RamsGloria Nelson is a 56 y.o. female [facility] today for right shoulder pain.  [name] suffered an injury to her right shoulder months ago.  After failing conservative management and a long period of waiting we proceeded with an MRI of her right shoulder obtained on the 24th showing significant full-thickness retracted tears of the supraspinatus and infraspinatus tendons.  She notes significant pain in her right shoulder worse with motion better with rest.  She denies any radiating pain weakness or numbness.   Past Medical History:  Diagnosis Date  . Diabetes mellitus without complication (HCC)   . Hyperlipidemia   . Hypertension    Past Surgical History:  Procedure Laterality Date  . ANKLE SURGERY     Social History   Tobacco Use  . Smoking status: Current Every Day Smoker    Packs/day: 0.50    Years: 20.00    Pack years: 10.00  . Smokeless tobacco: Never Used  Substance Use Topics  . Alcohol use: Yes    Alcohol/week: 0.0 oz    Comment: Drinks 6-7 beers in one day socially every couple weeks   family history includes Alcohol abuse in her father; Aneurysm in her sister; Diabetes in her mother; Heart disease in her father.  ROS as above:  Medications: Current Outpatient Medications  Medication Sig Dispense Refill  . aspirin EC 81 MG tablet Take 81 mg by mouth daily.    . clobetasol ointment (TEMOVATE) 0.05 % Apply 1 application topically 2 (two) times daily. 60 g 1  . clorazepate (TRANXENE) 3.75 MG tablet Take 1 tablet (3.75 mg total) by mouth at bedtime as needed for anxiety or sleep. 30 tablet 5  . conjugated estrogens (PREMARIN) vaginal cream Place 1 Applicatorful vaginally daily. 42.5 g 12  . diclofenac sodium (VOLTAREN) 1 % GEL Apply 2 g topically 4 (four) times daily. To affected joint. 100 g 11  . fluconazole (DIFLUCAN) 150 MG tablet    0  . fluticasone (FLONASE) 50 MCG/ACT nasal spray Place 2 sprays into both nostrils daily. 16 g 12  . glucose blood (BAYER CONTOUR NEXT TEST) test strip Test daily. E11.65 100 each 12  . JARDIANCE 25 MG TABS tablet TAKE ONE TABLET BY MOUTH ONCE DAILY 90 tablet 3  . lisinopril (PRINIVIL,ZESTRIL) 20 MG tablet TAKE 1 TABLET BY MOUTH ONCE DAILY 90 tablet 0  . metoprolol succinate (TOPROL-XL) 100 MG 24 hr tablet Take 1 tablet (100 mg total) by mouth daily. Take with or immediately following a meal. 90 tablet 3  . rosuvastatin (CRESTOR) 40 MG tablet TAKE 1 TABLET BY MOUTH ONCE DAILY DUE FOR LAB WORK. 90 tablet 0  . Saxagliptin-Metformin 5-500 MG TB24 Take 1 tablet by mouth daily. 90 tablet 3   No current facility-administered medications for this visit.    Allergies  Allergen Reactions  . Sulfa Antibiotics     Health Maintenance Health Maintenance  Topic Date Due  . FOOT EXAM  06/06/2017  . HEMOGLOBIN A1C  09/05/2017  . OPHTHALMOLOGY EXAM  11/30/2017  . MAMMOGRAM  02/09/2018  . COLONOSCOPY  03/26/2019  . PNEUMOCOCCAL POLYSACCHARIDE VACCINE (2) 01/27/2021  . PAP SMEAR  09/07/2021  . TETANUS/TDAP  01/27/2026  . INFLUENZA VACCINE  Completed  . Hepatitis C Screening  Completed  . HIV Screening  Completed     Exam:  BP 135/81   Pulse 97   Wt 182 lb (82.6 kg)   BMI  34.39 kg/m  Gen: Well NAD MSK: Right shoulder normal-appearing nontender. Range of motion is decreased with abduction limited to about 120 degrees active. Forward flexion is also limited about 130 degrees active. Normal external rotation.  Internal rotation is painful and limited. Strength is reduced with external and abduction testing. Pulses capillary refill and sensation are intact    No results found for this or any previous visit (from the past 72 hour(s)). Mr Shoulder Right Wo Contrast  Result Date: 04/03/2017 CLINICAL DATA:  Right shoulder pain, weakness and decreased range of motion for 6 months. No known  injury. EXAM: MRI OF THE RIGHT SHOULDER WITHOUT CONTRAST TECHNIQUE: Multiplanar, multisequence MR imaging of the shoulder was performed. No intravenous contrast was administered. COMPARISON:  Plain films right shoulder 12/06/2016. FINDINGS: Rotator cuff: The patient has complete supraspinatus and infraspinatus tendon tears with retraction almost to the glenoid, 5-6 cm. There is a partial width, full-thickness tear of the superior subscapularis. More inferiorly, interstitial tearing of the tendon is identified. Muscles: There is mild-to-moderate atrophy of the supraspinatus, infraspinatus and subscapularis. Biceps long head: Intact The tendon is medially subluxed out of the bicipital groove due to the patient's subscapularis tear tendinopathy is present. Acromioclavicular Joint: Moderate degenerative disease is identified. Type 2 acromion. Large volume of fluid in the subacromial/subdeltoid bursa. Glenohumeral Joint: Humeral head is high-riding. Labrum:  Intact. Bones:  No fracture or worrisome lesion. Other: None. IMPRESSION: Complete supraspinatus and infraspinatus tendon tears with 5-6 cm of retraction. Extensive partial tearing of the subscapularis is also present. There is some atrophy of all the muscles. Tendinopathy of the long head of biceps. The tendon is medially subluxed out of the bicipital groove due to the patient's subscapularis tear. Moderate acromioclavicular osteoarthritis. High-riding humeral head due to rotator cuff tears. Subacromial/subdeltoid bursitis. Electronically Signed   By: Drusilla Kannerhomas  Dalessio M.D.   On: 04/03/2017 11:39      Assessment and Plan: 56 y.o. female with right shoulder pain due to full-thickness retracted atrophy rotator cuff tear.  We had a lengthy discussion about the poor surgical prognosis for this.  I think given her degree of pain and failure to improve with steroid injections and conservative management is reasonable to have a consultation with orthopedic surgery.   She may benefit from a reverse total shoulder to help stabilize the shoulder joint or from potentially arthroscopic debridement.  Plan to recheck in about 3 months to follow-up her diabetes as well as her other medical issues.   Orders Placed This Encounter  Procedures  . Ambulatory referral to Orthopedic Surgery    Referral Priority:   Routine    Referral Type:   Surgical    Referral Reason:   Specialty Services Required    Referred to Provider:   Jones Broomhandler, Justin, MD    Requested Specialty:   Orthopedic Surgery    Number of Visits Requested:   1   No orders of the defined types were placed in this encounter.    Discussed warning signs or symptoms. Please see discharge instructions. Patient expresses understanding.  I spent 25 minutes with this patient, greater than 50% was face-to-face time counseling regarding ddx and treatment plan.

## 2017-04-21 ENCOUNTER — Other Ambulatory Visit: Payer: Self-pay | Admitting: Family Medicine

## 2017-04-21 DIAGNOSIS — IMO0001 Reserved for inherently not codable concepts without codable children: Secondary | ICD-10-CM

## 2017-04-21 DIAGNOSIS — E1165 Type 2 diabetes mellitus with hyperglycemia: Principal | ICD-10-CM

## 2017-04-28 ENCOUNTER — Other Ambulatory Visit: Payer: Self-pay | Admitting: Family Medicine

## 2017-04-28 DIAGNOSIS — J301 Allergic rhinitis due to pollen: Secondary | ICD-10-CM

## 2017-05-24 ENCOUNTER — Other Ambulatory Visit: Payer: Self-pay | Admitting: Family Medicine

## 2017-05-24 DIAGNOSIS — I10 Essential (primary) hypertension: Secondary | ICD-10-CM

## 2017-06-09 ENCOUNTER — Encounter: Payer: Self-pay | Admitting: Family Medicine

## 2017-06-09 ENCOUNTER — Ambulatory Visit: Payer: BLUE CROSS/BLUE SHIELD | Admitting: Family Medicine

## 2017-06-09 VITALS — BP 136/75 | HR 103 | Wt 179.0 lb

## 2017-06-09 DIAGNOSIS — I10 Essential (primary) hypertension: Secondary | ICD-10-CM | POA: Diagnosis not present

## 2017-06-09 DIAGNOSIS — E11649 Type 2 diabetes mellitus with hypoglycemia without coma: Secondary | ICD-10-CM | POA: Diagnosis not present

## 2017-06-09 DIAGNOSIS — T783XXA Angioneurotic edema, initial encounter: Secondary | ICD-10-CM

## 2017-06-09 LAB — POCT GLYCOSYLATED HEMOGLOBIN (HGB A1C): Hemoglobin A1C: 8.3

## 2017-06-09 NOTE — Progress Notes (Signed)
[facility] is a 57 y.o. female who presents to [facility]: Primary Care [facility] Medicine today for follow-up diabetes, hypertension, discussed angioedema, discuss right shoulder surgery.  [facility] had right shoulder surgery about a week ago.  The surgery note is not yet available however she describes debridement and repair of rotator cuff tendon.  She notes that the shoulder is quite painful but she is using oxycodone to control the pain which works reasonably well.  She notes oxycodone causes nausea but she tolerated it well.  Hypertension: [facility] continues to take metoprolol and lisinopril daily.  She denies chest pain palpitations or shortness of breath.  She notes a few weeks ago she had an episode where her lips swelled.  Her sister who is a nurse thought it was angioedema and advised her to stop taking the lisinopril.  She has not stopped it yet.  She would like to see how well her blood pressure is controlled just on metoprolol and is reluctant to restart another medication.  Diabetes: [facility] notes that her diabetes has worsened over the last few weeks following her surgery.  She does her blood sugars a bit elevated and she has been eating more than usual and is able to exercise less than usual due to her shoulder.  She continues to take medications listed below.   Past Medical History:  Diagnosis Date  . Diabetes mellitus without complication (HCC)   . Hyperlipidemia   . Hypertension    Past Surgical History:  Procedure Laterality Date  . ANKLE SURGERY     Social History   Tobacco Use  . Smoking status: Current Every Day Smoker    Packs/day: 0.50    Years: 20.00    Pack years: 10.00  . Smokeless tobacco: Never Used  Substance Use Topics  . Alcohol use: Yes    Alcohol/week: 0.0 oz    Comment: Drinks 6-7 beers in one day socially every couple weeks   family history includes Alcohol  abuse in her father; Aneurysm in her sister; Diabetes in her mother; Heart disease in her father.  ROS as above:  Medications: Current Outpatient Medications  Medication Sig Dispense Refill  . aspirin EC 81 MG tablet Take 81 mg by mouth daily.    . clorazepate (TRANXENE) 3.75 MG tablet Take 1 tablet (3.75 mg total) by mouth at bedtime as needed for anxiety or sleep. 30 tablet 5  . conjugated estrogens (PREMARIN) vaginal cream Place 1 Applicatorful vaginally daily. 42.5 g 12  . fluticasone (FLONASE) 50 MCG/ACT nasal spray USE TWO SPRAY(S) IN EACH NOSTRIL ONCE DAILY 16 g 12  . glucose blood (BAYER CONTOUR NEXT TEST) test strip Test daily. E11.65 100 each 12  . JARDIANCE 25 MG TABS tablet TAKE ONE TABLET BY MOUTH ONCE DAILY 90 tablet 3  . KOMBIGLYZE XR 5-500 MG TB24 TAKE 1 TABLET BY MOUTH ONCE DAILY 90 tablet 3  . metoprolol succinate (TOPROL-XL) 100 MG 24 hr tablet TAKE 1 TABLET BY MOUTH ONCE DAILY. TAKE WITH OR IMMEDIATELY FOLLOWING A MEAL. 90 tablet 3  . rosuvastatin (CRESTOR) 40 MG tablet TAKE 1 TABLET BY MOUTH ONCE DAILY DUE FOR LAB WORK. 90 tablet 0   No current facility-administered medications for this visit.    Allergies  Allergen Reactions  . Lisinopril Swelling    Non-life-threatening angioedema on lisinopril  . Sulfa Antibiotics     Health Maintenance Health Maintenance  Topic Date Due  . URINE MICROALBUMIN  06[birth year]/1969  .  OPHTHALMOLOGY EXAM  11/30/2017  . HEMOGLOBIN A1C  12/10/2017  . MAMMOGRAM  02/09/2018  . FOOT EXAM  06/10/2018  . COLONOSCOPY  03/26/2019  . PNEUMOCOCCAL POLYSACCHARIDE VACCINE (2) 01/27/2021  . PAP SMEAR  09/07/2021  . TETANUS/TDAP  01/27/2026  . INFLUENZA VACCINE  Completed  . Hepatitis C Screening  Completed  . HIV Screening  Completed     Exam:  BP 136/75   Pulse (!) 103   Wt 179 lb (81.2 kg)   BMI 33.82 kg/m  Gen: Well NAD HEENT: EOMI,  MMM Lungs: Normal work of breathing. CTABL Heart: RRR no MRG Abd: NABS, Soft. Nondistended,  Nontender Exts: Brisk capillary refill, warm and well perfused.  Right shoulder in sling range of motion not tested as postop status.    Results for orders placed or performed in visit on 06/09/17 (from the past 72 hour(s))  POCT HgB A1C     Status: None   Collection Time: 06/09/17  9:26 AM  Result Value Ref Range   Hemoglobin A1C 8.3    No results found.     Assessment and Plan: 57 y.o. female with  Hypertension: Blood pressure currently controlled however patient had what appeared to be angioedema and I discontinued the lisinopril today.  I would like to start an angiotensin receptor blocker as these are generally considered safe in the setting of mild angioedema due to ACE inhibitor.  However she is reluctant to take another blood pressure medication and wants to see how her blood pressure does just on metoprolol.  Plan to recheck in 1 month.  Diabetes: Worsening today.  Patient is reluctant to take more medication.  She wants to try lifestyle modification and will check back in a month.  We will keep diabetic blood sugar log and review that at the next visit.  Next step would be either GLP-1 or basal insulin.  Angioedema: History of angioedema based on her description of her events.  Additionally showed her pictures of people with angioedema and she says she looked exactly like most of the people with lip swelling due to angioedema.  Plan to avoid ACE inhibitors in the future.   Orders Placed This Encounter  Procedures  . POCT HgB A1C   No orders of the defined types were placed in this encounter.    Discussed warning signs or symptoms. Please see discharge instructions. Patient expresses understanding.

## 2017-06-09 NOTE — Patient Instructions (Addendum)
Thank you for coming in today. STOP lisinopril.  Consider starting losartan.  Keep track of blood pressure.  Recheck with me in 1 month about pressures.  We will check blood sugars over the next month.  Return in 1 month.  Let me know if things change.

## 2017-07-05 ENCOUNTER — Other Ambulatory Visit: Payer: Self-pay | Admitting: Family Medicine

## 2017-07-05 DIAGNOSIS — E782 Mixed hyperlipidemia: Secondary | ICD-10-CM

## 2017-07-10 ENCOUNTER — Ambulatory Visit: Payer: BLUE CROSS/BLUE SHIELD | Admitting: Family Medicine

## 2017-07-20 ENCOUNTER — Ambulatory Visit (INDEPENDENT_AMBULATORY_CARE_PROVIDER_SITE_OTHER): Payer: BLUE CROSS/BLUE SHIELD | Admitting: Rehabilitative and Restorative Service Providers"

## 2017-07-20 DIAGNOSIS — R293 Abnormal posture: Secondary | ICD-10-CM | POA: Diagnosis not present

## 2017-07-20 DIAGNOSIS — M6281 Muscle weakness (generalized): Secondary | ICD-10-CM

## 2017-07-20 DIAGNOSIS — M25511 Pain in right shoulder: Secondary | ICD-10-CM

## 2017-07-20 DIAGNOSIS — R29898 Other symptoms and signs involving the musculoskeletal system: Secondary | ICD-10-CM | POA: Diagnosis not present

## 2017-07-20 NOTE — Therapy (Signed)
[facility] [address] [place], [place], [address] Phone: [phone]   Fax:  [phone]  Physical Therapy Evaluation  Patient Details  Name: Melanie Nelson MRN: [id number] Date of Birth: July 05, 1960 Referring Provider: Dr Jones Broom    Encounter Date: 07/20/2017  PT End of Session - 07/20/17 1526    Visit Number  1    Number of Visits  12    Date for PT Re-Evaluation  10/13/17    PT Start Time  1530    PT Stop Time  1634    PT Time Calculation (min)  64 min    Activity Tolerance  Patient tolerated treatment well       Past Medical History:  Diagnosis Date  . Diabetes mellitus without complication (HCC)   . Hyperlipidemia   . Hypertension     Past Surgical History:  Procedure Laterality Date  . ANKLE SURGERY      There were no vitals filed for this visit.   Subjective Assessment - 07/20/17 1539    Subjective  Patient reports that whe has been having Rt shoulder pain about 6 months ago. She continued to have pain in the Rt shoulder for several months prior to surgery 05/29/17. She was taken out of the sling 07/16/17. She has had continued pain in the Rt shoulder.     Pertinent History  Lt ankle surgery ~ 10 yrs ago; HTN; AODM    How long can you sit comfortably?  30 min     Diagnostic tests  Rotator cuff: The patient has complete supraspinatus and infraspinitus tear with retraction of tendons; There is a partial width, full-thickness tear of the superior subscapularius     Patient Stated Goals  get arm moving again; help with the shoulder pain     Currently in Pain?  Yes    Pain Score  5     Pain Location  Shoulder    Pain Orientation  Right    Pain Descriptors / Indicators  Sharp;Aching;Dull sharp at times     Pain Type  Chronic pain;Surgical pain    Pain Radiating Towards  pain into the Rt mid arm(deltoid)    Pain Onset  More than a month ago    Pain Frequency  Intermittent    Aggravating Factors    difficulty lying in the bed; moving Rt arm; overdoing anything with the Rt UE     Pain Relieving Factors  better propping on the recliner; resting arm completely         [facility] PT Assessment - 07/20/17 0001      Assessment   Medical Diagnosis  Rt RCR     Referring Provider  Dr Jones Broom     Onset Date/Surgical Date  05/29/17    Hand Dominance  Left    Next MD Visit  08/21/17    Prior Therapy  none       Precautions   Precaution Comments  post op Rt RCR       Balance Screen   Has the patient fallen in the past 6 months  Yes    How many times?  1    Has the patient had a decrease in activity level because of a fear of falling?   No    Is the patient reluctant to leave their home because of a fear of falling?   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    Leisure  household chores; walking dog; yard work; Surveyor, quantity - Estate agent, Tax adviser, and piano; reading       Observation/Other Assessments   Focus on Therapeutic Outcomes (FOTO)   62% limitation      Observation/Other Assessments-Edema    Edema  -- intermittent swelling in the Rt hand since surgery       Sensation   Additional Comments  WFL's per pt report       Posture/Postural Control   Posture Comments  head forward; shoulders rounded and eelvated; increased thoracic kyphosis; scapulae abducted and rotated along the thoracic wall      AROM   Left Shoulder Extension  55 Degrees    Left Shoulder Flexion  146 Degrees    Left Shoulder ABduction  128 Degrees    Left Shoulder Internal Rotation  32 Degrees    Left Shoulder External Rotation  90 Degrees    Cervical Flexion  60    Cervical Extension  52    Cervical - Right Side Bend  45    Cervical - Left Side Bend  38    Cervical - Right  Rotation  70    Cervical - Left Rotation  78      Strength   Overall Strength Comments  5/5 Lt UE not tested Rt UE       Palpation   Spinal mobility  hypomobility thoracic spine     Palpation comment  tightness through the pecs; upper trap; leveator; teres; biceps; deltoid                 Objective measurements completed on examination: See above findings.      [facility] Adult PT Treatment/Exercise - 07/20/17 0001      Therapeutic Activites    Therapeutic Activities  -- instruction in myofacial ball release work       Neuro Re-ed    Neuro Re-ed Details   initiated postural correction       Shoulder Exercises: Standing   Other Standing Exercises  scap squeeze 10 sec x 10; axial extension 10 sec x 5      Shoulder Exercises: ROM/Strengthening   Pendulum  20 reps CW/CCW      Shoulder Exercises: Stretch   External Rotation Stretch  5 reps;10 seconds standing ER with cane elbow adducted     Table Stretch - Flexion  5 reps;10 seconds      Cryotherapy   Number Minutes Cryotherapy  20 Minutes    Cryotherapy Location  Shoulder Rt     Type of Cryotherapy  Ice pack      Electrical Stimulation   Electrical Stimulation Location  Rt shoulder girdle     Electrical Stimulation Action  IFC    Electrical Stimulation Parameters  to tolerance    Electrical Stimulation Goals  Pain;Tone             PT Education - 07/20/17 1613    Education provided  Yes    Education Details  HEP posture, ball release work; Museum/gallery curator) Educated  Patient    Methods  Explanation;Demonstration;Tactile cues;Verbal cues;Handout    Comprehension  Verbalized understanding;Returned demonstration;Verbal cues required;Tactile cues required       PT Short Term Goals -  07/20/17 1815      PT SHORT TERM GOAL #1   Title  Improve posture and alignment with patient to demonstrate improved upright posture with posterior scapular musculature engaged 08/31/17    Time  6    Period  Weeks     Status  New      PT SHORT TERM GOAL #2   Title  Decrease pain by 50-60% allowing patient to sleep in bed and participate in exercise program with minimal discomfort/pain 08/31/17    Time  6    Period  Weeks    Status  New      PT SHORT TERM GOAL #3   Title  Increase PROM to AROM Rt shoulder to range specified per protocol  08/31/17    Time  6    Period  Weeks    Status  New        PT Long Term Goals - 07/20/17 1818      PT LONG TERM GOAL #1   Title  Increase AROM to allow functional activites with Rt UE 10/13/17    Time  12    Period  Weeks    Status  New      PT LONG TERM GOAL #2   Title  Decrease pain by 50-75% allowing patient to progress with rehab per protocol 10/13/17    Time  12    Period  Weeks    Status  New      PT LONG TERM GOAL #3   Title  Independent in HEP for discharge 10/13/17    Time  12    Period  Weeks    Status  New      PT LONG TERM GOAL #4   Title  Improve FOTO to </= 39% limitation 10/13/17    Time  12    Period  Weeks    Status  New             Plan - 07/20/17 1803    Clinical Impression Statement  Verlinda presents s/p Rt RCR 05/29/17 following a 6 month history of Rt shoulder surgery. Patient has poor posture and alignment; limited ROM/mobility; decreased Rt UE strength; decreased functional activity level. Patient will benefit from PT to address problems identified.     History and Personal Factors relevant to plan of care:  AODM - may slow healing and increases chance of increased tightness; poor posture and alignment; smoker    Clinical Presentation  Evolving    Clinical Decision Making  Low    Rehab Potential  Good    PT Frequency  2x / week    PT Duration  12 weeks    PT Treatment/Interventions  Patient/family education;ADLs/Self Care Home Management;Dry needling;Manual techniques;Neuromuscular re-education;Cryotherapy;Electrical Stimulation;Iontophoresis 4mg /ml Dexamethasone;Moist Heat;Ultrasound;Therapeutic activities;Therapeutic exercise     PT Next Visit Plan  review HEP; continue with posture and alignment correction; progress with Rt shoulder rehab per protocol; manual work and PROM; modalities as indicated     Consulted and Agree with Plan of Care  Patient       Patient will benefit from skilled therapeutic intervention in order to improve the following deficits and impairments:  Postural dysfunction, Improper body mechanics, Pain, Increased fascial restricitons, Increased muscle spasms, Decreased mobility, Decreased range of motion, Decreased strength, Decreased activity tolerance, Impaired UE functional use  Visit Diagnosis: Acute pain of right shoulder - Plan: PT plan of care cert/re-cert  Abnormal posture - Plan: PT plan of care cert/re-cert  Muscle weakness (  generalized) - Plan: PT plan of care cert/re-cert  Other symptoms and signs involving the musculoskeletal system - Plan: PT plan of care cert/re-cert     Problem List Patient Active Problem List   Diagnosis Date Noted  . Angioedema 06/09/2017  . Lichen sclerosus 03/08/2017  . Vaginal atrophy 03/08/2017  . Acute pain of right shoulder 09/07/2016  . Diverticula of colon 03/28/2016  . Sessile colonic polyp 03/28/2016  . Seasonal allergies 01/28/2016  . HTN (hypertension) 10/29/2015  . Uncontrolled diabetes mellitus (HCC) 10/29/2015  . Smoking 10/29/2015  . Elevated triglycerides with high cholesterol 10/29/2015  . Obese 10/29/2015  . Androgenic alopecia 10/29/2015    Jeniya Flannigan Rober MinionP Hanalei Glace PT, MPH  07/20/2017, 6:26 PM  [facility] [address] [place], [place], [address] Phone: [phone]   Fax:  [phone]  Name: Melanie Nelson MRN: [id number] Date of Birth: 12/26/1960

## 2017-07-20 NOTE — Patient Instructions (Addendum)
Self massage using ~ 4 inch plastic ball  External Rotator Cuff Stretch, Supine (Passive)    Lie supine, one elbow against ribs, and bent at 90, dowel in palm, other hand holding dowel up. Use other arm to push forearm toward floor. Keep elbow against side. Hold _10__ seconds. Repeat _5__ times per session. Do _2-3__ sessions per day.   External Rotator Cuff Stretch, Sitting (Passive)can have table at side     Sit with elbow bent, forearm on table, palm down. Bend forward at waist until a stretch is felt in shoulder. Hold __10_ seconds.  Repeat _5__ times per session. Do _2-3__ sessions per day.   Axial Extension (Chin Tuck)    Pull chin in and lengthen back of neck. Hold __5__ seconds while counting out loud. Repeat __10__ times. Do __several__ sessions per day.  Shoulder Blade Squeeze   Swim noodle along spine  Rotate shoulders back, then squeeze shoulder blades down and back. 10 sec hold Repeat __10__ times. Do _several ___ sessions per day.   TENS UNIT: This is helpful for muscle pain and spasm.   Search and Purchase a TENS 7000 2nd edition at www.tenspros.com. It should be less than $30.     TENS unit instructions: Do not shower or bathe with the unit on Turn the unit off before removing electrodes or batteries If the electrodes lose stickiness add a drop of water to the electrodes after they are disconnected from the unit and place on plastic sheet. If you continued to have difficulty, call the TENS unit company to purchase more electrodes. Do not apply lotion on the skin area prior to use. Make sure the skin is clean and dry as this will help prolong the life of the electrodes. After use, always check skin for unusual red areas, rash or other skin difficulties. If there are any skin problems, does not apply electrodes to the same area. Never remove the electrodes from the unit by pulling the wires. Do not use the TENS unit or electrodes other than as  directed. Do not change electrode placement without consultating your therapist or physician. Keep 2 fingers with between each electrode.      [facility] [address] [place], [place] [address]  [phone] (office) [phone] (fax)

## 2017-07-27 ENCOUNTER — Ambulatory Visit (INDEPENDENT_AMBULATORY_CARE_PROVIDER_SITE_OTHER): Payer: BLUE CROSS/BLUE SHIELD | Admitting: Physical Therapy

## 2017-07-27 DIAGNOSIS — M25511 Pain in right shoulder: Secondary | ICD-10-CM

## 2017-07-27 DIAGNOSIS — R29898 Other symptoms and signs involving the musculoskeletal system: Secondary | ICD-10-CM

## 2017-07-27 DIAGNOSIS — M6281 Muscle weakness (generalized): Secondary | ICD-10-CM | POA: Diagnosis not present

## 2017-07-27 DIAGNOSIS — R293 Abnormal posture: Secondary | ICD-10-CM

## 2017-07-27 NOTE — Therapy (Signed)
[facility] [address] [place], [place], [address] Phone: [phone]   Fax:  [phone]  Physical Therapy Treatment  Patient Details  Name: Melanie Nelson MRN: [id number] Date of Birth: 03/30/1961 Referring Provider: Dr. Jones BroomJustin Chandler   Encounter Date: 07/27/2017  PT End of Session - 07/27/17 1454    Visit Number  2    Number of Visits  12    Date for PT Re-Evaluation  10/13/17    PT Start Time  1452    PT Stop Time  1545    PT Time Calculation (min)  53 min    Activity Tolerance  Patient tolerated treatment well    Behavior During Therapy  [facility] for tasks assessed/performed       Past Medical History:  Diagnosis Date  . Diabetes mellitus without complication (HCC)   . Hyperlipidemia   . Hypertension     Past Surgical History:  Procedure Laterality Date  . ANKLE SURGERY      There were no vitals filed for this visit.  Subjective Assessment - 07/27/17 1454    Subjective  "I'm sore from doing the exercises and if I use my arm".  Pt reports she struggles to start her ignition of her car; tries to get her body close so that she doesn't have to reach.  She complains of her arm, just below shoulder, feeling tight and swollen.     Diagnostic tests  Rotator cuff: The patient has complete supraspinatus and infraspinitus tear with retraction of tendons; There is a partial width, full-thickness tear of the superior subscapularius     Currently in Pain?  Yes    Pain Score  4     Pain Location  Shoulder    Pain Orientation  Right    Pain Descriptors / Indicators  Sore    Aggravating Factors   moving arm.     Pain Relieving Factors  resting         [facility] PT Assessment - 07/27/17 0001      Assessment   Medical Diagnosis  Rt RCR     Referring Provider  Dr. Jones BroomJustin Chandler    Onset Date/Surgical Date  05/29/17    Hand Dominance  Left    Next MD Visit  08/21/17    Prior Therapy  none           [facility] PT  Treatment/Exercise - 07/27/17 0001      Shoulder Exercises: Supine   External Rotation  PROM;Right;10 reps per HEP, with cane .      Shoulder Exercises: Seated   Flexion  PROM;10 reps    Other Seated Exercises  pt issued and educating a stress ball for hand strengthening.       Shoulder Exercises: Standing   Other Standing Exercises  scap squeeze 10 sec x 10; axial extension 3 sec x 5 (back against pool noodle)     Other Standing Exercises  pendulum - 10 CW/CCW, VC for keeping arm passive.  shoulder circles with arm resting at side, 10 each way.       Modalities   Modalities  Vasopneumatic      Programme researcher, broadcasting/film/videolectrical Stimulation   Electrical Stimulation Location  Rt shoulder girdle     Electrical Stimulation Action  IFC    Electrical Stimulation Parameters  to tolerance    Electrical Stimulation Goals  Tone;Pain      Vasopneumatic   Number Minutes Vasopneumatic   15 minutes  Vasopnuematic Location   Shoulder    Vasopneumatic Pressure  Low    Vasopneumatic Temperature   34 deg      Manual Therapy   Manual Therapy  Passive ROM    Manual therapy comments  Pt in supported supine     Passive ROM  Rt shoulder flexion and scaption to tissue tolerance (somewhat guarded on return of arm to neutral.                PT Short Term Goals - 07/20/17 1815      PT SHORT TERM GOAL #1   Title  Improve posture and alignment with patient to demonstrate improved upright posture with posterior scapular musculature engaged 08/31/17    Time  6    Period  Weeks    Status  New      PT SHORT TERM GOAL #2   Title  Decrease pain by 50-60% allowing patient to sleep in bed and participate in exercise program with minimal discomfort/pain 08/31/17    Time  6    Period  Weeks    Status  New      PT SHORT TERM GOAL #3   Title  Increase PROM to AROM Rt shoulder to range specified per protocol  08/31/17    Time  6    Period  Weeks    Status  New        PT Long Term Goals - 07/20/17 1818      PT LONG  TERM GOAL #1   Title  Increase AROM to allow functional activites with Rt UE 10/13/17    Time  12    Period  Weeks    Status  New      PT LONG TERM GOAL #2   Title  Decrease pain by 50-75% allowing patient to progress with rehab per protocol 10/13/17    Time  12    Period  Weeks    Status  New      PT LONG TERM GOAL #3   Title  Independent in HEP for discharge 10/13/17    Time  12    Period  Weeks    Status  New      PT LONG TERM GOAL #4   Title  Improve FOTO to </= 39% limitation 10/13/17    Time  12    Period  Weeks    Status  New            Plan - 07/27/17 1659    Clinical Impression Statement  Pt guarded with PROM; moved to just over 90 deg scaption and flexion. She had some discomfort with exercises; reminded to stay within tolerance. Pain decreased with use of estim and vaso.  Pt progressing gradually towards goals within rehab protocol.     Rehab Potential  Good    PT Next Visit Plan  PROM, continue progressing per MD rehab protocol.      Consulted and Agree with Plan of Care  Patient       Patient will benefit from skilled therapeutic intervention in order to improve the following deficits and impairments:  Postural dysfunction, Improper body mechanics, Pain, Increased fascial restricitons, Increased muscle spasms, Decreased mobility, Decreased range of motion, Decreased strength, Decreased activity tolerance, Impaired UE functional use  Visit Diagnosis: Acute pain of right shoulder  Abnormal posture  Muscle weakness (generalized)  Other symptoms and signs involving the musculoskeletal system     Problem List Patient Active Problem List  Diagnosis Date Noted  . Angioedema 06/09/2017  . Lichen sclerosus 03/08/2017  . Vaginal atrophy 03/08/2017  . Acute pain of right shoulder 09/07/2016  . Diverticula of colon 03/28/2016  . Sessile colonic polyp 03/28/2016  . Seasonal allergies 01/28/2016  . HTN (hypertension) 10/29/2015  . Uncontrolled diabetes mellitus  (HCC) 10/29/2015  . Smoking 10/29/2015  . Elevated triglycerides with high cholesterol 10/29/2015  . Obese 10/29/2015  . Androgenic alopecia 10/29/2015   Mayer Camel, PTA 07/27/17 5:02 PM  [facility] [place] [address] [place], [place], [address] Phone: [phone]   Fax:  [phone]  Name: Melanie Nelson MRN: [id number] Date of Birth: 1960/12/25

## 2017-07-31 ENCOUNTER — Ambulatory Visit (INDEPENDENT_AMBULATORY_CARE_PROVIDER_SITE_OTHER): Payer: BLUE CROSS/BLUE SHIELD | Admitting: Rehabilitative and Restorative Service Providers"

## 2017-07-31 ENCOUNTER — Encounter: Payer: Self-pay | Admitting: Rehabilitative and Restorative Service Providers"

## 2017-07-31 DIAGNOSIS — R29898 Other symptoms and signs involving the musculoskeletal system: Secondary | ICD-10-CM

## 2017-07-31 DIAGNOSIS — M25511 Pain in right shoulder: Secondary | ICD-10-CM

## 2017-07-31 DIAGNOSIS — M6281 Muscle weakness (generalized): Secondary | ICD-10-CM

## 2017-07-31 DIAGNOSIS — R293 Abnormal posture: Secondary | ICD-10-CM

## 2017-07-31 NOTE — Therapy (Signed)
[facility] [address] [place], [place], [address] Phone: [phone]   Fax:  [phone]  Physical Therapy Treatment  Patient Details  Name: Melanie RamsGloria Nelson MRN: [id number] Date of Birth: 02/05/1961 Referring Provider: Dr Jones BroomJustin Chandler   Encounter Date: 07/31/2017  PT End of Session - 07/31/17 1559    Visit Number  3    Number of Visits  12    Date for PT Re-Evaluation  10/13/17    PT Start Time  1559    PT Stop Time  1653    PT Time Calculation (min)  54 min    Activity Tolerance  Patient tolerated treatment well       Past Medical History:  Diagnosis Date  . Diabetes mellitus without complication (HCC)   . Hyperlipidemia   . Hypertension     Past Surgical History:  Procedure Laterality Date  . ANKLE SURGERY      There were no vitals filed for this visit.  Subjective Assessment - 07/31/17 1602    Subjective  Painful after last treatment but thinks it helped. Shoulder is feeling a little bit better. Trying to be careful with moving the shoulder.     Currently in Pain?  No/denies         [facility] 07/31/17 0001      Assessment   Medical Diagnosis  Rt RCR     Referring Provider  Dr Jones BroomJustin Chandler    Onset Date/Surgical Date  05/29/17    Hand Dominance  Left    Next MD Visit  08/21/17    Prior Therapy  none       PROM   Right/Left Shoulder  -- PROM Rt shoulder pt supine shd in scapular plane     Right Shoulder Extension  63 Degrees    Right Shoulder Flexion  117 Degrees    Right Shoulder ABduction  113 Degrees    Right Shoulder Internal Rotation  45 Degrees                   [facility] Adult PT Treatment/Exercise - 07/31/17 0001      Shoulder Exercises: Standing   Other Standing Exercises  scap squeeze 10 sec x 10; axial extension 3 sec x 5 (back against pool noodle)       Shoulder Exercises: ROM/Strengthening   Pendulum  20-30 reps CW/CCW      Shoulder Exercises:  Isometric Strengthening   Extension  5X5"    External Rotation  5X5"    Internal Rotation  5X5"    ABduction  5X5"      Shoulder Exercises: Stretch   External Rotation Stretch  5 reps;10 seconds with noodle - cane for ER in standing     Table Stretch - Flexion  5 reps;10 seconds rolling stool facing table - SBA for safety       Electrical Stimulation   Electrical Stimulation Location  Rt shoulder girdle     Electrical Stimulation Action  IFC    Electrical Stimulation Parameters  to tolerance    Electrical Stimulation Goals  Tone;Pain      Vasopneumatic   Number Minutes Vasopneumatic   15 minutes    Vasopnuematic Location   Shoulder    Vasopneumatic Pressure  Low    Vasopneumatic Temperature   34 deg      Manual Therapy   Manual therapy comments  pt in supine LE's in hooklying     Soft tissue mobilization  soft tissue work through the Rt shoulder girdle - pecs/upper trap/leveator/biceps/deltoid     Myofascial Release  anterior shoulder/pecs/biceps     Scapular Mobilization  Rt lateral and inferior glide     Passive ROM  Rt shoulder flexion; abduction and ER in scapular plane; extension with gentle extension of elbow 3-4 reps each direction to tissue extensibility and patient tolerance     Manual Traction  manual traction through long arm with oscilation              PT Education - 07/31/17 1655    Education provided  Yes    Education Details  HEP     Person(s) Educated  Patient    Methods  Explanation;Demonstration;Tactile cues;Verbal cues;Handout    Comprehension  Verbalized understanding;Returned demonstration;Verbal cues required;Tactile cues required       PT Short Term Goals - 07/31/17 1613      PT SHORT TERM GOAL #1   Title  Improve posture and alignment with patient to demonstrate improved upright posture with posterior scapular musculature engaged 08/31/17    Time  6    Period  Weeks    Status  On-going      PT SHORT TERM GOAL #2   Title  Decrease pain by  50-60% allowing patient to sleep in bed and participate in exercise program with minimal discomfort/pain 08/31/17    Time  6    Period  Weeks    Status  On-going      PT SHORT TERM GOAL #3   Title  Increase PROM to AROM Rt shoulder to range specified per protocol  08/31/17    Time  6    Period  Weeks    Status  On-going        PT Long Term Goals - 07/31/17 1613      PT LONG TERM GOAL #1   Title  Increase AROM to allow functional activites with Rt UE 10/13/17    Time  12    Period  Weeks    Status  On-going      PT LONG TERM GOAL #2   Title  Decrease pain by 50-75% allowing patient to progress with rehab per protocol 10/13/17    Time  12    Period  Weeks    Status  On-going      PT LONG TERM GOAL #3   Title  Independent in HEP for discharge 10/13/17    Time  12    Period  Weeks    Status  On-going      PT LONG TERM GOAL #4   Title  Improve FOTO to </= 39% limitation 10/13/17    Time  12    Period  Weeks    Status  On-going            Plan - 07/31/17 1614    Clinical Impression Statement  Decreasing pain. Some progress with PROM. Added gentle isometric exercises without difficulty. No goals accomplished. Continues to work toward goals per MD protocol.     Rehab Potential  Good    PT Frequency  2x / week    PT Duration  12 weeks    PT Treatment/Interventions  Patient/family education;ADLs/Self Care Home Management;Dry needling;Manual techniques;Neuromuscular re-education;Cryotherapy;Electrical Stimulation;Iontophoresis 4mg /ml Dexamethasone;Moist Heat;Ultrasound;Therapeutic activities;Therapeutic exercise    PT Next Visit Plan  PROM, continue progressing per MD rehab protocol.      Consulted and Agree with Plan of Care  Patient  Patient will benefit from skilled therapeutic intervention in order to improve the following deficits and impairments:  Postural dysfunction, Improper body mechanics, Pain, Increased fascial restricitons, Increased muscle spasms, Decreased  mobility, Decreased range of motion, Decreased strength, Decreased activity tolerance, Impaired UE functional use  Visit Diagnosis: Acute pain of right shoulder  Abnormal posture  Muscle weakness (generalized)  Other symptoms and signs involving the musculoskeletal system     Problem List Patient Active Problem List   Diagnosis Date Noted  . Angioedema 06/09/2017  . Lichen sclerosus 03/08/2017  . Vaginal atrophy 03/08/2017  . Acute pain of right shoulder 09/07/2016  . Diverticula of colon 03/28/2016  . Sessile colonic polyp 03/28/2016  . Seasonal allergies 01/28/2016  . HTN (hypertension) 10/29/2015  . Uncontrolled diabetes mellitus (HCC) 10/29/2015  . Smoking 10/29/2015  . Elevated triglycerides with high cholesterol 10/29/2015  . Obese 10/29/2015  . Androgenic alopecia 10/29/2015    Diannia Hogenson Rober Minion PT, MPH  07/31/2017, 5:01 PM  [facility] [address] [place], [place], [address] Phone: [phone]   Fax:  [phone]  Name: Christle Nolting MRN: [id number] Date of Birth: [birth year]

## 2017-07-31 NOTE — Patient Instructions (Addendum)
Strengthening: Isometric Extension    Using wall for resistance, press back of left arm into ball using light pressure. Hold __5__ seconds. Repeat __5__ times per set. Do __1__ sessions per day.   Strengthening: Isometric Abduction    Using wall for resistance, press left arm into ball using light pressure. Hold __5__ seconds. Repeat __5__ times per set. Do __1__ sessions per day.   Strengthening: Isometric External Rotation    Using wall to provide resistance, and keeping right arm at side, press back of hand into ball using light pressure. Hold __5__ seconds. Repeat __5__ times per set. Do __1__ sessions per day.   Strengthening: Isometric Internal Rotation    Using door frame for resistance, press palm of right hand into ball using light pressure. Keep elbow in at side. Hold _5___ seconds. Repeat _5___ times per set.  Do __1__ sessions per day.   ROM: Flexion    Keeping left arm on table, slide body away until stretch is felt. Hold __10__ seconds. Repeat _5-8___ times per set. Do __2-3__ sessions per day.   SUPINE Tips A    Being in the supine position means to be lying on the back. Lying on the back is the position of least compression on the bones and discs of the spine, and helps to re-align the natural curves of the back. Arms resting out to side for 2-5 min - bend elbows to release stretch if you have discomfort. Gradually bring arms up toward ears as the stretch becomes easy. Do not lift arm off bed!

## 2017-08-04 ENCOUNTER — Ambulatory Visit (INDEPENDENT_AMBULATORY_CARE_PROVIDER_SITE_OTHER): Payer: BLUE CROSS/BLUE SHIELD | Admitting: Physical Therapy

## 2017-08-04 ENCOUNTER — Encounter: Payer: Self-pay | Admitting: Physical Therapy

## 2017-08-04 DIAGNOSIS — R29898 Other symptoms and signs involving the musculoskeletal system: Secondary | ICD-10-CM | POA: Diagnosis not present

## 2017-08-04 DIAGNOSIS — M25511 Pain in right shoulder: Secondary | ICD-10-CM

## 2017-08-04 DIAGNOSIS — M6281 Muscle weakness (generalized): Secondary | ICD-10-CM

## 2017-08-04 DIAGNOSIS — R293 Abnormal posture: Secondary | ICD-10-CM | POA: Diagnosis not present

## 2017-08-04 NOTE — Therapy (Signed)
[facility] [place] [address] [place], [place], [address] Phone: [phone]   Fax:  [phone]  Physical Therapy Treatment  Patient Details  Name: Melanie Nelson MRN: [id number] Date of Birth: [birth year] Referring Provider: Dr. Jones Broom   Encounter Date: 08/04/2017  PT End of Session - 08/04/17 1527    Visit Number  4    Number of Visits  12    Date for PT Re-Evaluation  10/13/17    PT Start Time  1533    PT Stop Time  1630    PT Time Calculation (min)  57 min    Activity Tolerance  Patient tolerated treatment well    Behavior During Therapy  [facility] for tasks assessed/performed       Past Medical History:  Diagnosis Date  . Diabetes mellitus without complication (HCC)   . Hyperlipidemia   . Hypertension     Past Surgical History:  Procedure Laterality Date  . ANKLE SURGERY      There were no vitals filed for this visit.  Subjective Assessment - 08/04/17 1534    Subjective  Melanie Nelson thinks she might have slept on her shoulder wrong; it was hurting in the morning. She usually sleeps with it slightly elevated.    Pertinent History  Lt ankle surgery ~ 10 yrs ago; HTN; AODM    How long can you sit comfortably?  30 min     Diagnostic tests  Rotator cuff: The patient has complete supraspinatus and infraspinitus tear with retraction of tendons; There is a partial width, full-thickness tear of the superior subscapularius     Patient Stated Goals  get arm moving again; help with the shoulder pain     Currently in Pain?  Yes    Pain Score  4     Pain Location  Shoulder    Pain Orientation  Right    Pain Descriptors / Indicators  Sore    Pain Onset  More than a month ago         [facility] PT Assessment - 08/04/17 0001      Assessment   Medical Diagnosis  R RCR    Referring Provider  Dr. Jones Broom    Onset Date/Surgical Date  05/29/17    Hand Dominance  Left    Next MD Visit  08/21/17    Prior Therapy  none           [facility] Adult PT Treatment/Exercise - 08/04/17 0001      Shoulder Exercises: Standing   Other Standing Exercises  scap squeeze 10 sec x 10; axial extension 3 sec x 5 (back against pool noodle)  with pool noodle; tactile cues to avoid elevating shoulders      Shoulder Exercises: Pulleys   Flexion  3 minutes limited range, VC to stay within tolerable range.     Scaption  3 minutes      Shoulder Exercises: Isometric Strengthening   Flexion  5X5"    Extension  3X5"    External Rotation  3X5"    Internal Rotation  3X5"    ABduction  --      Shoulder Exercises: Stretch   External Rotation Stretch  5 reps;10 seconds with cane in standing; tactile cues needed for form    Table Stretch - Flexion  5 reps;10 seconds rolling stool facing table - SBA for safety       Electrical Stimulation   Electrical Stimulation Location  Rt shoulder girdle  Electrical Stimulation Action  IFC    Electrical Stimulation Parameters  to tolerance    Electrical Stimulation Goals  Tone;Pain      Vasopneumatic   Number Minutes Vasopneumatic   15 minutes    Vasopnuematic Location   Shoulder R    Vasopneumatic Pressure  Low    Vasopneumatic Temperature   34 deg      Manual Therapy   Passive ROM  R shoulder flexion; abduction and ER in scapular plane; extension with gentle extension of elbow 3-4 reps each direction to tissue extensibility and patient tolerance               PT Short Term Goals - 08/04/17 1625      PT SHORT TERM GOAL #1   Title  Improve posture and alignment with patient to demonstrate improved upright posture with posterior scapular musculature engaged 08/31/17    Time  6    Period  Weeks    Status  On-going      PT SHORT TERM GOAL #2   Title  Decrease pain by 50-60% allowing patient to sleep in bed and participate in exercise program with minimal discomfort/pain 08/31/17    Time  6    Period  Weeks    Status  On-going      PT SHORT TERM GOAL #3   Title  Increase  PROM to AROM Rt shoulder to range specified per protocol  08/31/17    Time  6    Period  Weeks    Status  On-going        PT Long Term Goals - 08/04/17 1625      PT LONG TERM GOAL #1   Title  Increase AROM to allow functional activites with Rt UE 10/13/17    Time  12    Period  Weeks    Status  On-going      PT LONG TERM GOAL #2   Title  Decrease pain by 50-75% allowing patient to progress with rehab per protocol 10/13/17    Time  12    Period  Weeks    Status  On-going      PT LONG TERM GOAL #3   Title  Independent in HEP for discharge 10/13/17    Time  12    Period  Weeks    Status  On-going      PT LONG TERM GOAL #4   Title  Improve FOTO to </= 39% limitation 10/13/17    Time  12    Period  Weeks    Status  On-going        Plan - 08/04/17 1634    Clinical Impression Statement  Melanie Nelson demonstrates little guarding with PROM. She has tolerated new home exercises with no increase in pain, requiring min. VCs for form. She continues to work toward goals per surgical protocol.    Rehab Potential  Good    PT Frequency  2x / week    PT Duration  12 weeks    PT Treatment/Interventions  Patient/family education;ADLs/Self Care Home Management;Dry needling;Manual techniques;Neuromuscular re-education;Cryotherapy;Electrical Stimulation;Iontophoresis 4mg /ml Dexamethasone;Moist Heat;Ultrasound;Therapeutic activities;Therapeutic exercise    PT Next Visit Plan  PROM, STM, continue progressing per MD rehab protocol.    Consulted and Agree with Plan of Care  Patient       Patient will benefit from skilled therapeutic intervention in order to improve the following deficits and impairments:  Postural dysfunction, Improper body mechanics, Pain, Increased fascial restricitons, Increased muscle spasms,  Decreased mobility, Decreased range of motion, Decreased strength, Decreased activity tolerance, Impaired UE functional use  Visit Diagnosis: Acute pain of right shoulder  Abnormal  posture  Muscle weakness (generalized)  Other symptoms and signs involving the musculoskeletal system     Problem List Patient Active Problem List   Diagnosis Date Noted  . Angioedema 06/09/2017  . Lichen sclerosus 03/08/2017  . Vaginal atrophy 03/08/2017  . Acute pain of right shoulder 09/07/2016  . Diverticula of colon 03/28/2016  . Sessile colonic polyp 03/28/2016  . Seasonal allergies 01/28/2016  . HTN (hypertension) 10/29/2015  . Uncontrolled diabetes mellitus (HCC) 10/29/2015  . Smoking 10/29/2015  . Elevated triglycerides with high cholesterol 10/29/2015  . Obese 10/29/2015  . Androgenic alopecia 10/29/2015    Pernell DupreAdam Ikenna Ohms, SPTA 08/04/2017, 4:37 PM   This entire session was performed under direct supervision and direction of a licensed physical therapist assistant. I have personally read, edited and approve of the note as written. ]Jennifer Carlson-Long, PTA 08/04/17 4:47 PM.   [facility] [address] [place], [place], [address] Phone: [phone]   Fax:  [phone]  Name: Melanie Nelson MRN: [id number] Date of Birth: 06/07/1960

## 2017-08-07 ENCOUNTER — Encounter: Payer: BLUE CROSS/BLUE SHIELD | Admitting: Physical Therapy

## 2017-08-10 ENCOUNTER — Ambulatory Visit (INDEPENDENT_AMBULATORY_CARE_PROVIDER_SITE_OTHER): Payer: BLUE CROSS/BLUE SHIELD | Admitting: Physical Therapy

## 2017-08-10 ENCOUNTER — Other Ambulatory Visit: Payer: Self-pay | Admitting: Family Medicine

## 2017-08-10 DIAGNOSIS — M25511 Pain in right shoulder: Secondary | ICD-10-CM | POA: Diagnosis not present

## 2017-08-10 DIAGNOSIS — M6281 Muscle weakness (generalized): Secondary | ICD-10-CM

## 2017-08-10 DIAGNOSIS — R293 Abnormal posture: Secondary | ICD-10-CM

## 2017-08-10 DIAGNOSIS — R29898 Other symptoms and signs involving the musculoskeletal system: Secondary | ICD-10-CM

## 2017-08-10 NOTE — Therapy (Signed)
[facility] [place] [address] [place], [place], [address] Phone: [phone]   Fax:  [phone]  Physical Therapy Treatment  Patient Details  Name: Melanie Nelson MRN: [id number] Date of Birth: 09/07/1960 Referring Provider: Dr. Jones Broom   Encounter Date: 08/10/2017  PT End of Session - 08/10/17 1456    Visit Number  5    Number of Visits  12    Date for PT Re-Evaluation  10/13/17    PT Start Time  1405    PT Stop Time  1458    PT Time Calculation (min)  53 min    Activity Tolerance  Patient tolerated treatment well    Behavior During Therapy  [facility] for tasks assessed/performed       Past Medical History:  Diagnosis Date  . Diabetes mellitus without complication (HCC)   . Hyperlipidemia   . Hypertension     Past Surgical History:  Procedure Laterality Date  . ANKLE SURGERY      There were no vitals filed for this visit.  Subjective Assessment - 08/10/17 1640    Subjective  Pt reports she feels she is moving her Rt shoulder more. She still has difficulty washing and shaving Rt armpit.   Her shoulder is not as sore as it has been in past.     Patient Stated Goals  get arm moving again; help with the shoulder pain     Currently in Pain?  Yes    Pain Score  5     Pain Location  Shoulder    Pain Orientation  Right    Pain Descriptors / Indicators  Sore    Aggravating Factors   moving arm outside comfortable range    Pain Relieving Factors  rest, ice.          [facility] PT Assessment - 08/10/17 0001      Assessment   Medical Diagnosis  R RCR    Referring Provider  Dr. Jones Broom    Onset Date/Surgical Date  05/29/17    Hand Dominance  Left    Next MD Visit  08/21/17      PROM   Right/Left Shoulder  Right    Right Shoulder Flexion  125 Degrees supine    Right Shoulder ABduction  130 Degrees supine    Right Shoulder External Rotation  68 Degrees scaption of ~30, supported.         [facility] Adult PT  Treatment/Exercise - 08/10/17 0001      Shoulder Exercises: Supine   Horizontal ABduction  --    External Rotation  AAROM;10 reps cane    Flexion  AAROM;Both;10 reps cane, bench press motion.    Other Supine Exercises  AAROM into flexion with assist of her LUE, trial of AAROM to bring Rt hand to forehead.       Shoulder Exercises: Standing   Internal Rotation  AAROM;Both;10 reps cane behind back     Extension  AAROM;Both;10 reps cane    Other Standing Exercises  wall ladder up to #15, flexion RUE x 5 reps, with LUE assist to lower down.       Shoulder Exercises: Pulleys   Flexion  3 minutes    Scaption  3 minutes      Electrical Stimulation   Electrical Stimulation Location  Rt bicep    Electrical Stimulation Action  premod    Electrical Stimulation Parameters  to tolerance     Electrical Stimulation Goals  Pain  Vasopneumatic   Number Minutes Vasopneumatic   15 minutes    Vasopnuematic Location   Shoulder R    Vasopneumatic Pressure  Low    Vasopneumatic Temperature   34 deg      Manual Therapy   Passive ROM  Rt shoulder flexion, abduction and ER in scapular plane to pt and tissue tolerance               PT Short Term Goals - 08/04/17 1625      PT SHORT TERM GOAL #1   Title  Improve posture and alignment with patient to demonstrate improved upright posture with posterior scapular musculature engaged 08/31/17    Time  6    Period  Weeks    Status  On-going      PT SHORT TERM GOAL #2   Title  Decrease pain by 50-60% allowing patient to sleep in bed and participate in exercise program with minimal discomfort/pain 08/31/17    Time  6    Period  Weeks    Status  On-going      PT SHORT TERM GOAL #3   Title  Increase PROM to AROM Rt shoulder to range specified per protocol  08/31/17    Time  6    Period  Weeks    Status  On-going        PT Long Term Goals - 08/04/17 1625      PT LONG TERM GOAL #1   Title  Increase AROM to allow functional activites with  Rt UE 10/13/17    Time  12    Period  Weeks    Status  On-going      PT LONG TERM GOAL #2   Title  Decrease pain by 50-75% allowing patient to progress with rehab per protocol 10/13/17    Time  12    Period  Weeks    Status  On-going      PT LONG TERM GOAL #3   Title  Independent in HEP for discharge 10/13/17    Time  12    Period  Weeks    Status  On-going      PT LONG TERM GOAL #4   Title  Improve FOTO to </= 39% limitation 10/13/17    Time  12    Period  Weeks    Status  On-going            Plan - 08/10/17 1507    Clinical Impression Statement  Pt's PROM progressing well; improved from last visit.   Introduced AAROM with cane; tolerated ext and IR well, flexion more challenging.   She continues to be somewhat guarded with manual therapy, although improving, and ROM exercises.   Rehab Potential  Good    PT Frequency  2x / week    PT Duration  12 weeks    PT Treatment/Interventions  Patient/family education;ADLs/Self Care Home Management;Dry needling;Manual techniques;Neuromuscular re-education;Cryotherapy;Electrical Stimulation;Iontophoresis /ml Dexamethasone;Moist Heat;Ultrasound;Therapeutic activities;Therapeutic exercise    PT Next Visit Plan   continue progressing Rt shoulder per MD rehab protocol.    Consulted and Agree with Plan of Care  Patient       Patient will benefit from skilled therapeutic intervention in order to improve the following deficits and impairments:  Postural dysfunction, Improper body mechanics, Pain, Increased fascial restricitons, Increased muscle spasms, Decreased mobility, Decreased range of motion, Decreased strength, Decreased activity tolerance, Impaired UE functional use  Visit Diagnosis: Acute pain of right shoulder  Abnormal posture  Muscle weakness (generalized)  Other symptoms and signs involving the musculoskeletal system     Problem List Patient Active Problem List   Diagnosis Date Noted  . Angioedema 06/09/2017  . Lichen  sclerosus 03/08/2017  . Vaginal atrophy 03/08/2017  . Acute pain of right shoulder 09/07/2016  . Diverticula of colon 03/28/2016  . Sessile colonic polyp 03/28/2016  . Seasonal allergies 01/28/2016  . HTN (hypertension) 10/29/2015  . Uncontrolled diabetes mellitus (HCC) 10/29/2015  . Smoking 10/29/2015  . Elevated triglycerides with high cholesterol 10/29/2015  . Obese 10/29/2015  . Androgenic alopecia 10/29/2015   Mayer Camel, PTA 08/10/17 4:42 PM  [facility] [place] [address] [place], [place], [address] Phone: [phone]   Fax:  [phone]  Name: Melanie Nelson MRN: [id number] Date of Birth: 17-Apr-1960

## 2017-08-10 NOTE — Patient Instructions (Signed)
Cane Exercise: Extension    Stand holding cane behind back with both hands palm-up. Lift the cane away from body. Hold __3__ seconds. Repeat __10__ times. Do _2___ sessions per day.   Cane Exercise: Extension / Internal Rotation    Stand holding cane behind back with both hands palm-up. Slide cane up spine toward head. Hold __3__ seconds. Repeat __10__ times. Do _2___ sessions per day.  Cane Exercise: Flexion    Lie on back, holding cane above chest. Keeping arms as straight as possible, lower cane toward floor beyond head. Hold __3__ seconds. Repeat __10__ times. Do _2_ sessions per day.  SHOULDER: External Rotation - Supine (Cane)    Hold cane with both hands. Rotate arm away from body. Keep elbow on floor and next to body. __10_ reps per set, _2__ sets per day,   [facility] [address] [place], [place] [address]  [phone] (office) [phone] (fax)

## 2017-08-11 ENCOUNTER — Other Ambulatory Visit: Payer: Self-pay | Admitting: Family Medicine

## 2017-08-15 ENCOUNTER — Telehealth: Payer: Self-pay | Admitting: Family Medicine

## 2017-08-15 MED ORDER — CLORAZEPATE DIPOTASSIUM 3.75 MG PO TABS: 3.7500 mg | ORAL_TABLET | Freq: Every day | ORAL | 0 refills | Status: DC

## 2017-08-15 NOTE — Telephone Encounter (Signed)
Tranxene refilled 

## 2017-08-16 ENCOUNTER — Ambulatory Visit (INDEPENDENT_AMBULATORY_CARE_PROVIDER_SITE_OTHER): Payer: BLUE CROSS/BLUE SHIELD | Admitting: Physical Therapy

## 2017-08-16 DIAGNOSIS — R293 Abnormal posture: Secondary | ICD-10-CM | POA: Diagnosis not present

## 2017-08-16 DIAGNOSIS — R29898 Other symptoms and signs involving the musculoskeletal system: Secondary | ICD-10-CM

## 2017-08-16 DIAGNOSIS — M25511 Pain in right shoulder: Secondary | ICD-10-CM

## 2017-08-16 DIAGNOSIS — M6281 Muscle weakness (generalized): Secondary | ICD-10-CM

## 2017-08-16 NOTE — Therapy (Signed)
[facility] [place] [address] [place], [place], [address] Phone: [phone]   Fax:  [phone]  Physical Therapy Treatment  Patient Details  Name: Melanie Nelson MRN: [id number] Date of Birth: [birth year] Referring Provider: Dr. Jones Broom    Encounter Date: 08/16/2017  PT End of Session - 08/16/17 1645    Visit Number  6    Number of Visits  12    Date for PT Re-Evaluation  10/13/17    PT Start Time  1604    PT Stop Time  1657    PT Time Calculation (min)  53 min    Behavior During Therapy  [facility] for tasks assessed/performed       Past Medical History:  Diagnosis Date  . Diabetes mellitus without complication (HCC)   . Hyperlipidemia   . Hypertension     Past Surgical History:  Procedure Laterality Date  . ANKLE SURGERY      There were no vitals filed for this visit.  Subjective Assessment - 08/16/17 1607    Subjective  Pt reports she hasn't done much over last 2-3 days due to hamstring strain; was protecting LE and UE.  She reports she's using Rt arm more with laundry and ADLs.      Patient Stated Goals  get arm moving again; help with the shoulder pain     Currently in Pain?  No/denies    Pain Score  0-No pain up to 4/10 with exercise.     Pain Location  Shoulder    Pain Orientation  Right         [facility] PT Assessment - 08/16/17 0001      Assessment   Medical Diagnosis  R RCR    Referring Provider  Dr. Jones Broom     Onset Date/Surgical Date  05/29/17    Hand Dominance  Left    Next MD Visit  08/21/17      PROM   Right Shoulder Flexion  128 Degrees supine       [facility] Adult PT Treatment/Exercise - 08/16/17 0001      Shoulder Exercises: Supine   Flexion  AAROM;Both;10 reps;Limitations cane, bench press motion.    Flexion Limitations  then 5 reps of press up to flexion toward head.       Shoulder Exercises: Standing   Internal Rotation  AAROM;Both;15 reps cane behind back     Extension   AAROM;Both;20 reps cane      Shoulder Exercises: Pulleys   Flexion  3 minutes    Scaption  3 minutes      Electrical Stimulation   Electrical Stimulation Location  Rt bicep    Electrical Stimulation Action  premod     Electrical Stimulation Parameters  to tolerance    Electrical Stimulation Goals  Pain      Vasopneumatic   Number Minutes Vasopneumatic   15 minutes    Vasopnuematic Location   Shoulder R    Vasopneumatic Pressure  Low    Vasopneumatic Temperature   34 deg      Manual Therapy   Manual therapy comments  pt in supine LE's in hooklying     Soft tissue mobilization  soft tissue work through the Rt shoulder girdle - pecs/upper trap/leveator/biceps/deltoid     Myofascial Release  anterior shoulder/pecs/biceps     Passive ROM  Rt shoulder flexion, abduction and ER in scapular plane to pt and tissue tolerance    Manual Traction  manual traction through  long arm with oscilation                PT Short Term Goals - 08/04/17 1625      PT SHORT TERM GOAL #1   Title  Improve posture and alignment with patient to demonstrate improved upright posture with posterior scapular musculature engaged 08/31/17    Time  6    Period  Weeks    Status  On-going      PT SHORT TERM GOAL #2   Title  Decrease pain by 50-60% allowing patient to sleep in bed and participate in exercise program with minimal discomfort/pain 08/31/17    Time  6    Period  Weeks    Status  On-going      PT SHORT TERM GOAL #3   Title  Increase PROM to AROM Rt shoulder to range specified per protocol  08/31/17    Time  6    Period  Weeks    Status  On-going        PT Long Term Goals - 08/04/17 1625      PT LONG TERM GOAL #1   Title  Increase AROM to allow functional activites with Rt UE 10/13/17    Time  12    Period  Weeks    Status  On-going      PT LONG TERM GOAL #2   Title  Decrease pain by 50-75% allowing patient to progress with rehab per protocol 10/13/17    Time  12    Period  Weeks     Status  On-going      PT LONG TERM GOAL #3   Title  Independent in HEP for discharge 10/13/17    Time  12    Period  Weeks    Status  On-going      PT LONG TERM GOAL #4   Title  Improve FOTO to </= 39% limitation 10/13/17    Time  12    Period  Weeks    Status  On-going            Plan - 08/16/17 1646    Clinical Impression Statement  Pt less guarded with PROM today.  She was able to tolerate AAROM shoulder flexion a little better today; decreased tolerance for Rt shoulder ER AAROM due to pain.  Progressing gradually within rehab protocol    PT Frequency  2x / week    PT Duration  12 weeks    PT Treatment/Interventions  Patient/family education;ADLs/Self Care Home Management;Dry needling;Manual techniques;Neuromuscular re-education;Cryotherapy;Electrical Stimulation;Iontophoresis /ml Dexamethasone;Moist Heat;Ultrasound;Therapeutic activities;Therapeutic exercise    PT Next Visit Plan  MD note, FOTO, progress Rt shoulder per rehab protocol.     Consulted and Agree with Plan of Care  Patient       Patient will benefit from skilled therapeutic intervention in order to improve the following deficits and impairments:  Postural dysfunction, Improper body mechanics, Pain, Increased fascial restricitons, Increased muscle spasms, Decreased mobility, Decreased range of motion, Decreased strength, Decreased activity tolerance, Impaired UE functional use  Visit Diagnosis: Acute pain of right shoulder  Abnormal posture  Muscle weakness (generalized)  Other symptoms and signs involving the musculoskeletal system     Problem List Patient Active Problem List   Diagnosis Date Noted  . Angioedema 06/09/2017  . Lichen sclerosus 03/08/2017  . Vaginal atrophy 03/08/2017  . Acute pain of right shoulder 09/07/2016  . Diverticula of colon 03/28/2016  . Sessile colonic polyp 03/28/2016  . Seasonal  allergies 01/28/2016  . HTN (hypertension) 10/29/2015  . Uncontrolled diabetes mellitus  (HCC) 10/29/2015  . Smoking 10/29/2015  . Elevated triglycerides with high cholesterol 10/29/2015  . Obese 10/29/2015  . Androgenic alopecia 10/29/2015   Mayer Camel, PTA 08/16/17 4:52 PM  [facility] [place] [address] [place], [place], [address] Phone: [phone]   Fax:  [phone]  Name: Jimma Ortman MRN: [id number] Date of Birth: October 18, 1960

## 2017-08-18 ENCOUNTER — Ambulatory Visit (INDEPENDENT_AMBULATORY_CARE_PROVIDER_SITE_OTHER): Payer: BLUE CROSS/BLUE SHIELD | Admitting: Physical Therapy

## 2017-08-18 DIAGNOSIS — M25511 Pain in right shoulder: Secondary | ICD-10-CM | POA: Diagnosis not present

## 2017-08-18 DIAGNOSIS — M6281 Muscle weakness (generalized): Secondary | ICD-10-CM | POA: Diagnosis not present

## 2017-08-18 DIAGNOSIS — R293 Abnormal posture: Secondary | ICD-10-CM

## 2017-08-18 DIAGNOSIS — R29898 Other symptoms and signs involving the musculoskeletal system: Secondary | ICD-10-CM | POA: Diagnosis not present

## 2017-08-18 NOTE — Therapy (Signed)
[facility] [place] [address] [place], [place], [address] Phone: [phone]   Fax:  [phone]  Physical Therapy Treatment  Patient Details  Name: Melanie Nelson MRN: [id number] Date of Birth: 1961-01-26 Referring Provider: Dr. Jones Broom    Encounter Date: 08/18/2017  PT End of Session - 08/18/17 1400    Visit Number  7    Number of Visits  12    Date for PT Re-Evaluation  10/13/17    PT Start Time  1330    PT Stop Time  1413    PT Time Calculation (min)  43 min    Activity Tolerance  Patient tolerated treatment well    Behavior During Therapy  [facility] for tasks assessed/performed       Past Medical History:  Diagnosis Date  . Diabetes mellitus without complication (HCC)   . Hyperlipidemia   . Hypertension     Past Surgical History:  Procedure Laterality Date  . ANKLE SURGERY      There were no vitals filed for this visit.  Subjective Assessment - 08/18/17 1358    Subjective  Pt reports she was sore from the massage and stretching last visit.  She is trying to use her Rt arm more around house and is now having an easier time washing hair.  She returns to surgeon next week.     Patient Stated Goals  get arm moving again; help with the shoulder pain     Currently in Pain?  Yes    Pain Score  2     Pain Location  Shoulder    Pain Orientation  Right    Pain Descriptors / Indicators  Sore    Aggravating Factors   moving arm outside comfortable range    Pain Relieving Factors  rest, ice.          [facility] PT Assessment - 08/18/17 0001      Assessment   Medical Diagnosis  R RCR    Referring Provider  Dr. Jones Broom     Onset Date/Surgical Date  05/29/17    Hand Dominance  Left    Next MD Visit  08/21/17      ROM / Strength   AROM / PROM / Strength  AROM;PROM      AROM   AROM Assessment Site  Shoulder measured in standing    Right/Left Shoulder  Right    Right Shoulder Extension  36 Degrees    Right  Shoulder Flexion  69 Degrees    Right Shoulder ABduction  45 Degrees    Right Shoulder External Rotation  50 Degrees standing, elbow at side      PROM   Right Shoulder Flexion  136 Degrees    Right Shoulder External Rotation  68 Degrees scaption, arm supported.         [facility] Adult PT Treatment/Exercise - 08/18/17 0001      Elbow Exercises   Elbow Flexion  Strengthening;Right;10 reps;Seated;Bar weights/barbell 2 sets, 1# and 2#      Shoulder Exercises: Supine   External Rotation  AAROM;10 reps cane    Flexion  AAROM;Both;10 reps;Limitations cane, bench press motion into overhead reach.    Flexion Limitations  challenging on return to neutral       Shoulder Exercises: Sidelying   External Rotation  Strengthening;Right;10 reps;Weights    External Rotation Weight (lbs)  1    ABduction  Right;10 reps;AROM small range, full can.  Shoulder Exercises: Standing   Internal Rotation  AAROM;Both;15 reps cane behind back     Extension  AAROM;Both;20 reps cane    Other Standing Exercises  wall ladder up to #19, flexion RUE x 5 reps, with LUE assist to lower down. VC to not elevate shoulder      Electrical Stimulation   Electrical Stimulation Location  Rt bicep    Electrical Stimulation Action  premod     Electrical Stimulation Parameters  to tolerance    Electrical Stimulation Goals  Pain      Vasopneumatic   Number Minutes Vasopneumatic   15 minutes    Vasopnuematic Location   Shoulder R    Vasopneumatic Pressure  Low    Vasopneumatic Temperature   34 deg               PT Short Term Goals - 08/04/17 1625      PT SHORT TERM GOAL #1   Title  Improve posture and alignment with patient to demonstrate improved upright posture with posterior scapular musculature engaged 08/31/17    Time  6    Period  Weeks    Status  On-going      PT SHORT TERM GOAL #2   Title  Decrease pain by 50-60% allowing patient to sleep in bed and participate in exercise program with minimal  discomfort/pain 08/31/17    Time  6    Period  Weeks    Status  On-going      PT SHORT TERM GOAL #3   Title  Increase PROM to AROM Rt shoulder to range specified per protocol  08/31/17    Time  6    Period  Weeks    Status  On-going        PT Long Term Goals - 08/04/17 1625      PT LONG TERM GOAL #1   Title  Increase AROM to allow functional activites with Rt UE 10/13/17    Time  12    Period  Weeks    Status  On-going      PT LONG TERM GOAL #2   Title  Decrease pain by 50-75% allowing patient to progress with rehab per protocol 10/13/17    Time  12    Period  Weeks    Status  On-going      PT LONG TERM GOAL #3   Title  Independent in HEP for discharge 10/13/17    Time  12    Period  Weeks    Status  On-going      PT LONG TERM GOAL #4   Title  Improve FOTO to </= 39% limitation 10/13/17    Time  12    Period  Weeks    Status  On-going            Plan - 08/18/17 1505    Clinical Impression Statement  Pt's Rt shoulder ROM slowly progressing.  She is somewhat guarded during PROM and requires some encouragement for AAROM exercises. Pain is limiting factor. She demonstrates decreased Rt shoulder AROM/strength. Focus has been on improving shoulder ROM prior to introducing strengthening exercises.  Pt will benefit from continued PT intervention to maximize functional mobility and safety.       Rehab Potential  Good    PT Frequency  2x / week    PT Duration  12 weeks    PT Treatment/Interventions  Patient/family education;ADLs/Self Care Home Management;Dry needling;Manual techniques;Neuromuscular re-education;Cryotherapy;Electrical Stimulation;Iontophoresis /ml Dexamethasone;Moist Heat;Ultrasound;Therapeutic activities;Therapeutic  exercise    PT Next Visit Plan  progress Rt shoulder per rehab protocol. FOTO.     Consulted and Agree with Plan of Care  Patient       Patient will benefit from skilled therapeutic intervention in order to improve the following deficits and  impairments:  Postural dysfunction, Improper body mechanics, Pain, Increased fascial restricitons, Increased muscle spasms, Decreased mobility, Decreased range of motion, Decreased strength, Decreased activity tolerance, Impaired UE functional use  Visit Diagnosis: Acute pain of right shoulder  Abnormal posture  Muscle weakness (generalized)  Other symptoms and signs involving the musculoskeletal system     Problem List Patient Active Problem List   Diagnosis Date Noted  . Angioedema 06/09/2017  . Lichen sclerosus 03/08/2017  . Vaginal atrophy 03/08/2017  . Acute pain of right shoulder 09/07/2016  . Diverticula of colon 03/28/2016  . Sessile colonic polyp 03/28/2016  . Seasonal allergies 01/28/2016  . HTN (hypertension) 10/29/2015  . Uncontrolled diabetes mellitus (HCC) 10/29/2015  . Smoking 10/29/2015  . Elevated triglycerides with high cholesterol 10/29/2015  . Obese 10/29/2015  . Androgenic alopecia 10/29/2015   Mayer Camel, PTA 08/18/17 3:17 PM  [facility] [place] [address] [place], [place], [address] Phone: [phone]   Fax:  [phone]  Name: Melanie Nelson MRN: [id number] Date of Birth: Mar 15, 1961

## 2017-08-21 ENCOUNTER — Encounter: Payer: BLUE CROSS/BLUE SHIELD | Admitting: Physical Therapy

## 2017-09-13 ENCOUNTER — Encounter: Payer: Self-pay | Admitting: Family Medicine

## 2017-09-13 ENCOUNTER — Ambulatory Visit (INDEPENDENT_AMBULATORY_CARE_PROVIDER_SITE_OTHER): Payer: BLUE CROSS/BLUE SHIELD | Admitting: Family Medicine

## 2017-09-13 VITALS — BP 135/73 | HR 92 | Ht 61.0 in | Wt 181.0 lb

## 2017-09-13 DIAGNOSIS — F17200 Nicotine dependence, unspecified, uncomplicated: Secondary | ICD-10-CM

## 2017-09-13 DIAGNOSIS — E11649 Type 2 diabetes mellitus with hypoglycemia without coma: Secondary | ICD-10-CM | POA: Diagnosis not present

## 2017-09-13 DIAGNOSIS — I10 Essential (primary) hypertension: Secondary | ICD-10-CM | POA: Diagnosis not present

## 2017-09-13 DIAGNOSIS — E782 Mixed hyperlipidemia: Secondary | ICD-10-CM | POA: Diagnosis not present

## 2017-09-13 LAB — POCT GLYCOSYLATED HEMOGLOBIN (HGB A1C): Hemoglobin A1C: 8.4 % — AB (ref 4.0–5.6)

## 2017-09-13 NOTE — Patient Instructions (Addendum)
Thank you for coming in today. Think about nicotine lozenges Work on reduced carbs. Try to get less than 60g per meal.  Recheck with me in 3 months.  Get fasting labs in the near future.

## 2017-09-13 NOTE — Progress Notes (Signed)
Melanie Nelson is a 57 y.o. female [name] today for follow-up diabetes, hypertension, hyperlipidemia.  Diabetes: Melanie BondsGloria takes the medications listed below for diabetes.  She had trouble tolerating max dose of metformin.  She notes her blood sugar is occasionally in the 180s fasting.  She notes that she her diet has worsened slightly and she is no longer eating a significantly low carbohydrate diet.  She is trying to exercise most of the days of the week.  She denies polyuria or polydipsia.  She would like to avoid further additional medications if possible.  Hypertension: Melanie BondsGloria had difficulty tolerating lisinopril.  She takes metoprolol daily.  She denies chest pain lightheadedness or dizziness and feels quite well.  Hyperlipidemia: Melanie BondsGloria takes Crestor and 81 mg of aspirin daily.  She denies significant muscle aches or pains.  Smoking: Patient continues to smoke about 10 cigarettes a day.  She is reluctant to consider Chantix.  She did not do very well with nicotine gum in the past.   ROS as above:  Exam:  BP 135/73   Pulse 92   Ht 5\' 1"  (1.549 m)   Wt 181 lb (82.1 kg)   BMI 34.20 kg/m  Gen: Well NAD HEENT: EOMI,  MMM Lungs: Normal work of breathing. CTABL Heart: RRR no MRG Abd: NABS, Soft. Nondistended, Nontender Exts: Brisk capillary refill, warm and well perfused.   Lab and Radiology Results Results for orders placed or performed in visit on 09/13/17 (from the past 72 hour(s))  POCT HgB A1C     Status: Abnormal   Collection Time: 09/13/17  8:42 AM  Result Value Ref Range   Hemoglobin A1C 8.4 (A) 4.0 - 5.6 %   HbA1c, POC (prediabetic range)  5.7 - 6.4 %   HbA1c, POC (controlled diabetic range)  0.0 - 7.0 %   No results found.   Assessment and Plan: 57 y.o. female with  Diabetes: Not ideally controlled at this time with A1c at  8.4.  Discussion about options.  Patient declines further medications at this point.  Next obvious step would be basal insulin versus GLP-1 agonist.  Plan to continue current oral medications and work on reduced carbohydrate diet and increased exercise.  Recheck in 3 months.  Hypertension: Doing well.  Continue current regimen.  Check basic fasting labs.  Additionally will check urine microalbumin.  Hyperlipidemia: Continue current regimen.  Check basic fasting labs.  Smoking: Discussion about smoking cessation options.  Recommend lozenges at 4 mg.  Recheck in 3 months.   Orders Placed This Encounter  Procedures  . CBC  . COMPLETE METABOLIC PANEL WITH GFR  . Lipid Panel w/reflex Direct LDL  . Microalbumin, urine  . POCT HgB A1C   No orders of the defined types were placed in this encounter.    Historical information moved to improve visibility of documentation.  Past Medical History:  Diagnosis Date  . Diabetes mellitus without complication (HCC)   . Hyperlipidemia   . Hypertension    Past Surgical History:  Procedure Laterality Date  . ANKLE SURGERY     Social History   Tobacco Use  . Smoking status: Current Every Day Smoker    Packs/day: 0.50    Years: 20.00    Pack years: 10.00  . Smokeless tobacco: Never Used  Substance Use Topics  . Alcohol use: Yes    Alcohol/week: 0.0 oz    Comment: Drinks 6-7 beers  in one day socially every couple weeks   family history includes Alcohol abuse in her father; Aneurysm in her sister; Diabetes in her mother; Heart disease in her father.  Medications: Current Outpatient Medications  Medication Sig Dispense Refill  . aspirin EC 81 MG tablet Take 81 mg by mouth daily.    . clorazepate (TRANXENE) 3.75 MG tablet Take 1 tablet (3.75 mg total) by mouth at bedtime. APPOINTMENT NEEDED FOR FURTHER REFILLS 30 tablet 0  . fluticasone (FLONASE) 50 MCG/ACT nasal spray USE TWO SPRAY(S) IN EACH NOSTRIL ONCE DAILY 16 g 12  . glucose blood  (BAYER CONTOUR NEXT TEST) test strip Test daily. E11.65 100 each 12  . JARDIANCE 25 MG TABS tablet TAKE ONE TABLET BY MOUTH ONCE DAILY 90 tablet 3  . KOMBIGLYZE XR 5-500 MG TB24 TAKE 1 TABLET BY MOUTH ONCE DAILY 90 tablet 3  . metoprolol succinate (TOPROL-XL) 100 MG 24 hr tablet TAKE 1 TABLET BY MOUTH ONCE DAILY. TAKE WITH OR IMMEDIATELY FOLLOWING A MEAL. 90 tablet 3  . rosuvastatin (CRESTOR) 40 MG tablet TAKE 1 TABLET BY MOUTH ONCE DAILY DUE FOR LAB WORK. 90 tablet 0   No current facility-administered medications for this visit.    Allergies  Allergen Reactions  . Lisinopril Swelling    Non-life-threatening angioedema on lisinopril  . Sulfa Antibiotics     Health Maintenance Health Maintenance  Topic Date Due  . URINE MICROALBUMIN  06/11/1969  . INFLUENZA VACCINE  11/09/2017  . OPHTHALMOLOGY EXAM  11/30/2017  . HEMOGLOBIN A1C  12/10/2017  . MAMMOGRAM  02/09/2018  . FOOT EXAM  06/10/2018  . COLONOSCOPY  03/26/2019  . PNEUMOCOCCAL POLYSACCHARIDE VACCINE (2) 01/27/2021  . PAP SMEAR  09/07/2021  . TETANUS/TDAP  01/27/2026  . Hepatitis C Screening  Completed  . HIV Screening  Completed    Discussed warning signs or symptoms. Please see discharge instructions. Patient expresses understanding.

## 2017-09-18 ENCOUNTER — Ambulatory Visit (INDEPENDENT_AMBULATORY_CARE_PROVIDER_SITE_OTHER): Payer: BLUE CROSS/BLUE SHIELD | Admitting: Physical Therapy

## 2017-09-18 DIAGNOSIS — R293 Abnormal posture: Secondary | ICD-10-CM

## 2017-09-18 DIAGNOSIS — M25511 Pain in right shoulder: Secondary | ICD-10-CM | POA: Diagnosis not present

## 2017-09-18 DIAGNOSIS — M6281 Muscle weakness (generalized): Secondary | ICD-10-CM | POA: Diagnosis not present

## 2017-09-18 DIAGNOSIS — R29898 Other symptoms and signs involving the musculoskeletal system: Secondary | ICD-10-CM

## 2017-09-18 NOTE — Patient Instructions (Signed)
  Low Row: Single Arm   Face anchor in stride stance. Palm up, pull arm back while squeezing shoulder blades together. Repeat 10__ times per set. Repeat with other arm. Do _2-3_ sets per session. Do 3__ sessions per week. Anchor Height: Waist  Press: Thumb Up (Single Arm)   Face away from anchor in stride stance, leg forward opposite exercising arm. Press arm forward with thumb up. Repeat 10 times per set.  Do _2-3_ sets per session. Do _3_ sessions per week. Anchor Height: Chest  Rotation: External (Single Arm)   Side toward anchor in shoulder width stance with elbow bent to 90, arm across mid-section. Thumb up, pull arm away from body, keeping elbow bent. Repeat _10_ times per set. Repeat with other arm. Do _2-3_ sets per session. Do _3_ sessions per week. Anchor Height: Waist  Rotation: Internal (Single Arm)   Side toward anchor in shoulder width stance with elbow bent to 90, forearm away from body. Thumb up, pull arm across body keeping elbow bent. Repeat _10_ times per set. Repeat with other arm. Do _2-3_ sets per session. Do _3_ sessions per week. Anchor Height: Waist   [place] [facility] [address] [place], [place] [address]  [phone] (office) [phone] (fax)    

## 2017-09-18 NOTE — Therapy (Addendum)
[place], [place], [address] Phone: [phone]   Fax:  [phone]  Physical Therapy Treatment  Patient Details  Name: Sammantha Mehlhaff MRN: [id number] Date of Birth: 1960-08-10 Referring Provider: Dr. Tania Ade    Encounter Date: 09/18/2017  PT End of Session - 09/18/17 1106    Visit Number  8    Number of Visits  12    Date for PT Re-Evaluation  10/13/17    PT Start Time  1103    PT Stop Time  1201    PT Time Calculation (min)  58 min       Past Medical History:  Diagnosis Date  . Diabetes mellitus without complication ([place])   . Hyperlipidemia   . Hypertension     Past Surgical History:  Procedure Laterality Date  . ANKLE SURGERY      There were no vitals filed for this visit.  Subjective Assessment - 09/18/17 1108    Subjective  Pt reports she has been away from therapy due to selling house and moving. She has been using her Rt arm.  "Surgeon says I'm on track, I return to him in the end of June".  She has noticed she can now roll over to her side in bed, with less difficulty.     Patient Stated Goals  get arm moving again; help with the shoulder pain     Currently in Pain?  Yes    Pain Score  4     Pain Location  Shoulder    Pain Orientation  Right    Pain Descriptors / Indicators  Sore    Aggravating Factors   moving arm outside comfortable range    Pain Relieving Factors  rest, ice         [facility] PT Assessment - 09/18/17 0001      Assessment   Medical Diagnosis  R RCR    Referring Provider  Dr. Tania Ade     Onset Date/Surgical Date  05/29/17    Hand Dominance  Left    Next MD Visit  10/04/17      Observation/Other Assessments   Focus on Therapeutic Outcomes (FOTO)   48% limited (goal of 39%).      AROM   AROM Assessment Site  Shoulder in standing    Right/Left Shoulder  Right    Right Shoulder Extension  55 Degrees    Right Shoulder Flexion  75 Degrees     Right Shoulder ABduction  70 Degrees      PROM   Right Shoulder Flexion  136 Degrees AAROM with cane    Right Shoulder External Rotation  50 Degrees        [facility] Adult PT Treatment/Exercise - 09/18/17 0001      Shoulder Exercises: Supine   External Rotation  AAROM;5 reps;Right cane    Flexion  AAROM;Both;10 reps;Limitations cane, bench press motion into overhead reach.    Flexion Limitations  improved ability to return to neutral from 90 deg flexion      Shoulder Exercises: Standing   External Rotation  Strengthening;Right;10 reps;Theraband [place] 4.    Theraband Level (Shoulder External Rotation)  Level 1 (Yellow)    Internal Rotation  AAROM;Both;15 reps cane behind back, and band [place] 4.     Flexion  Right;10 reps;Theraband rockwood 4    Theraband Level (Shoulder Flexion)  Level 1 (Yellow)    Extension  AAROM;Both;20 reps cane    Row  Strengthening;Right;10 reps;Theraband rockwood 4    Theraband Level (Shoulder Row)  Level 1 (Yellow)      Shoulder Exercises: Pulleys   Flexion  3 minutes    Scaption  3 minutes VC to avoid leaning      Shoulder Exercises: Stretch   Other Shoulder Stretches  low doorway stretch x 15 sec x 3 reps       Vasopneumatic   Number Minutes Vasopneumatic   15 minutes    Vasopnuematic Location   Shoulder R    Vasopneumatic Pressure  Low    Vasopneumatic Temperature   34 deg      Manual Therapy   Manual Therapy  Taping    Manual therapy comments  I strip of Reg rock tape applied along prox bicep tendon, 15% stretch to facilitate muscle, perpendicular strip applied mid muscle at site of pain with 70% stretch to decrease pain and decompress tissue.                PT Short Term Goals - 09/18/17 1139      PT SHORT TERM GOAL #1   Title  Improve posture and alignment with patient to demonstrate improved upright posture with posterior scapular musculature engaged 08/31/17    Time  6    Period  Weeks    Status  Partially Met      PT  SHORT TERM GOAL #2   Title  Decrease pain by 50-60% allowing patient to sleep in bed and participate in exercise program with minimal discomfort/pain 08/31/17    Status  Achieved      PT SHORT TERM GOAL #3   Title  Increase PROM to AROM Rt shoulder to range specified per protocol  08/31/17    Time  6    Period  Weeks    Status  Achieved        PT Long Term Goals - 09/18/17 1134      PT LONG TERM GOAL #1   Title  Increase AROM to allow functional activites with Rt UE 10/13/17    Time  12    Period  Weeks    Status  On-going      PT LONG TERM GOAL #2   Title  Decrease pain by 50-75% allowing patient to progress with rehab per protocol 10/13/17    Time  12    Period  Weeks    Status  Achieved 70% reduction 09/18/17      PT LONG TERM GOAL #3   Title  Independent in HEP for discharge 10/13/17    Time  12    Period  Weeks    Status  On-going      PT LONG TERM GOAL #4   Title  Improve FOTO to </= 39% limitation 10/13/17    Time  12    Period  Weeks    Status  On-going            Plan - 09/18/17 1300    Clinical Impression Statement  Pt had demonstrated some gradual improvements in functional strength and ROM since last visit 1 month ago.  Pt able to perform supine AAROM shoulder flexion with greater ease.  She is hesistant to perform shoulder ER stretch in supine/ standing. HEP progressed today.      Rehab Potential  Good    PT Frequency  2x / week    PT Duration  12 weeks    PT Treatment/Interventions  Patient/family education;ADLs/Self Care Home Management;Dry needling;Manual  techniques;Neuromuscular re-education;Cryotherapy;Electrical Stimulation;Iontophoresis 97m/ml Dexamethasone;Moist Heat;Ultrasound;Therapeutic activities;Therapeutic exercise    PT Next Visit Plan  progress Rt shoulder per rehab protocol.     Consulted and Agree with Plan of Care  Patient       Patient will benefit from skilled therapeutic intervention in order to improve the following deficits and  impairments:  Postural dysfunction, Improper body mechanics, Pain, Increased fascial restricitons, Increased muscle spasms, Decreased mobility, Decreased range of motion, Decreased strength, Decreased activity tolerance, Impaired UE functional use  Visit Diagnosis: Acute pain of right shoulder  Abnormal posture  Muscle weakness (generalized)  Other symptoms and signs involving the musculoskeletal system     Problem List Patient Active Problem List   Diagnosis Date Noted  . Angioedema 06/09/2017  . Lichen sclerosus 104/07/5911 . Vaginal atrophy 03/08/2017  . Acute pain of right shoulder 09/07/2016  . Diverticula of colon 03/28/2016  . Sessile colonic polyp 03/28/2016  . Seasonal allergies 01/28/2016  . HTN (hypertension) 10/29/2015  . Uncontrolled diabetes mellitus ([place] 10/29/2015  . Smoking 10/29/2015  . Elevated triglycerides with high cholesterol 10/29/2015  . Obese 10/29/2015  . Androgenic alopecia 10/29/2015   JKerin Perna PTA 09/18/17 2:23 PM  [address] [address] [place] [address]Phone: [phone]  Fax:  [phone] Name: GMaddalena LinarezMRN: [id number] of Birth: [birth year] PHYSICAL THERAPY DISCHARGE SUMMARY  Visits from Start of Care: 8  Current functional level related to goals / functional outcomes: See progress note for discharge status   Remaining deficits: Unknown    Education / Equipment: HEP  Plan: Patient agrees to discharge.  Patient goals were partially met. Patient is being discharged due to not returning since the last visit.  ?????     Celyn P. HHelene KelpPT, MPH 11/03/17 2:51 PM

## 2017-09-20 ENCOUNTER — Encounter: Payer: BLUE CROSS/BLUE SHIELD | Admitting: Physical Therapy

## 2017-09-30 ENCOUNTER — Other Ambulatory Visit: Payer: Self-pay | Admitting: Family Medicine

## 2017-09-30 DIAGNOSIS — E782 Mixed hyperlipidemia: Secondary | ICD-10-CM

## 2017-10-26 ENCOUNTER — Other Ambulatory Visit: Payer: Self-pay | Admitting: Family Medicine

## 2017-10-26 DIAGNOSIS — E782 Mixed hyperlipidemia: Secondary | ICD-10-CM

## 2017-10-31 ENCOUNTER — Telehealth: Payer: Self-pay | Admitting: Family Medicine

## 2017-10-31 NOTE — Telephone Encounter (Signed)
Spoke with Pt. She has appt in Sept and will get labs completed one week before.

## 2017-10-31 NOTE — Telephone Encounter (Signed)
Labs were ordered at the visit in June and are still due.  Please get labs at your convenience.  Labs should be done fasting.  You can have medicine and water that morning.

## 2017-11-16 ENCOUNTER — Other Ambulatory Visit: Payer: Self-pay

## 2017-11-16 ENCOUNTER — Telehealth: Payer: Self-pay

## 2017-11-16 DIAGNOSIS — E1165 Type 2 diabetes mellitus with hyperglycemia: Principal | ICD-10-CM

## 2017-11-16 DIAGNOSIS — E11649 Type 2 diabetes mellitus with hypoglycemia without coma: Secondary | ICD-10-CM

## 2017-11-16 DIAGNOSIS — IMO0001 Reserved for inherently not codable concepts without codable children: Secondary | ICD-10-CM

## 2017-11-16 MED ORDER — GLUCOSE BLOOD VI STRP: ORAL_STRIP | 12 refills | Status: AC

## 2017-11-16 NOTE — Telephone Encounter (Signed)
Patient has appointment for December 14, 2017 for diabetes follow up. A1C was added to previous lab order. Rhonda Cunningham,CMA

## 2017-11-27 ENCOUNTER — Other Ambulatory Visit: Payer: Self-pay | Admitting: Family Medicine

## 2017-11-27 DIAGNOSIS — E782 Mixed hyperlipidemia: Secondary | ICD-10-CM

## 2017-11-27 LAB — COMPLETE METABOLIC PANEL WITH GFR
AG RATIO: 2.2 (calc) (ref 1.0–2.5)
ALKALINE PHOSPHATASE (APISO): 62 U/L (ref 33–130)
ALT: 25 U/L (ref 6–29)
AST: 19 U/L (ref 10–35)
Albumin: 4.6 g/dL (ref 3.6–5.1)
BILIRUBIN TOTAL: 0.4 mg/dL (ref 0.2–1.2)
BUN: 15 mg/dL (ref 7–25)
CHLORIDE: 106 mmol/L (ref 98–110)
CO2: 23 mmol/L (ref 20–32)
Calcium: 9.6 mg/dL (ref 8.6–10.4)
Creat: 0.56 mg/dL (ref 0.50–1.05)
GFR, EST AFRICAN AMERICAN: 119 mL/min/{1.73_m2} (ref >=60)
GFR, Est Non African American: 103 mL/min/{1.73_m2} (ref >=60)
GLUCOSE: 197 mg/dL — AB (ref 65–99)
Globulin: 2.1 g/dL (calc) (ref 1.9–3.7)
POTASSIUM: 4 mmol/L (ref 3.5–5.3)
Sodium: 140 mmol/L (ref 135–146)
Total Protein: 6.7 g/dL (ref 6.1–8.1)

## 2017-11-27 LAB — LIPID PANEL W/REFLEX DIRECT LDL
CHOLESTEROL: 157 mg/dL (ref <200)
HDL: 34 mg/dL — ABNORMAL LOW (ref >50)
LDL CHOLESTEROL (CALC): 89 mg/dL
Non-HDL Cholesterol (Calc): 123 mg/dL (calc) (ref <130)
TRIGLYCERIDES: 261 mg/dL — AB (ref <150)
Total CHOL/HDL Ratio: 4.6 (calc) (ref <5.0)

## 2017-11-27 LAB — CBC
HCT: 43 % (ref 35.0–45.0)
Hemoglobin: 14.6 g/dL (ref 11.7–15.5)
MCH: 28 pg (ref 27.0–33.0)
MCHC: 34 g/dL (ref 32.0–36.0)
MCV: 82.5 fL (ref 80.0–100.0)
MPV: 10.6 fL (ref 7.5–12.5)
PLATELETS: 226 10*3/uL (ref 140–400)
RBC: 5.21 10*6/uL — AB (ref 3.80–5.10)
RDW: 13.5 % (ref 11.0–15.0)
WBC: 7.8 10*3/uL (ref 3.8–10.8)

## 2017-11-28 LAB — MICROALBUMIN, URINE: Microalb, Ur: 5.1 mg/dL

## 2017-11-28 LAB — HEMOGLOBIN A1C
EAG (MMOL/L): 11.2 (calc)
HEMOGLOBIN A1C: 8.7 %{Hb} — AB (ref <5.7)
MEAN PLASMA GLUCOSE: 203 (calc)

## 2017-12-14 ENCOUNTER — Ambulatory Visit (INDEPENDENT_AMBULATORY_CARE_PROVIDER_SITE_OTHER): Payer: BLUE CROSS/BLUE SHIELD | Admitting: Family Medicine

## 2017-12-14 ENCOUNTER — Encounter: Payer: Self-pay | Admitting: Family Medicine

## 2017-12-14 VITALS — BP 118/80 | HR 99 | Temp 98.7°F | Wt 177.0 lb

## 2017-12-14 DIAGNOSIS — I10 Essential (primary) hypertension: Secondary | ICD-10-CM

## 2017-12-14 DIAGNOSIS — F17200 Nicotine dependence, unspecified, uncomplicated: Secondary | ICD-10-CM

## 2017-12-14 DIAGNOSIS — E1159 Type 2 diabetes mellitus with other circulatory complications: Secondary | ICD-10-CM

## 2017-12-14 DIAGNOSIS — Z6833 Body mass index (BMI) 33.0-33.9, adult: Secondary | ICD-10-CM

## 2017-12-14 DIAGNOSIS — E6609 Other obesity due to excess calories: Secondary | ICD-10-CM

## 2017-12-14 DIAGNOSIS — Z1231 Encounter for screening mammogram for malignant neoplasm of breast: Secondary | ICD-10-CM | POA: Diagnosis not present

## 2017-12-14 DIAGNOSIS — E782 Mixed hyperlipidemia: Secondary | ICD-10-CM | POA: Diagnosis not present

## 2017-12-14 DIAGNOSIS — Z1239 Encounter for other screening for malignant neoplasm of breast: Secondary | ICD-10-CM

## 2017-12-14 DIAGNOSIS — I152 Hypertension secondary to endocrine disorders: Secondary | ICD-10-CM

## 2017-12-14 MED ORDER — CLORAZEPATE DIPOTASSIUM 3.75 MG PO TABS: 3.7500 mg | ORAL_TABLET | Freq: Every day | ORAL | 4 refills | Status: AC

## 2017-12-14 MED ORDER — ROSUVASTATIN CALCIUM 40 MG PO TABS: 40.0000 mg | ORAL_TABLET | Freq: Every day | ORAL | 3 refills | Status: AC

## 2017-12-14 NOTE — Patient Instructions (Addendum)
Thank you for coming in today. Reduce cabs.  Try to get less than 45-60 grams of carbs per meal.   Continue current medicine.   Continue to cut back on smoking.   Make sure the eye exam people send me the notes.  Fax [phone]  Recheck in 3 months or sooner if needed.

## 2017-12-14 NOTE — Progress Notes (Signed)
Melanie Nelson is a 57 y.o. female who presents to [name]: Primary Care Sports Medicine today for diabetes hypertension hyperlipidemia smoking.  Melanie Nelson has diabetes.  She takes medications listed below.  She has trouble tolerating higher doses of metformin.  She notes that her diet has worsened recently and she does not pay attention to carbohydrates.  No polyuria or polydipsia or hypoglycemic episodes.  She is scheduled for diabetic eye exam next month.  Hypertension: She takes metoprolol below.  No chest pain palpitations shortness of breath.  No lightheadedness or dizziness.  Hyperlipidemia: She takes rosuvastatin below.  She addition takes aspirin as well.  No chest pain palpitations.  Melanie Nelson has cut back to about a half a pack a day of smoking.  She is not able to quit smoking currently.  ROS as above:  Exam:  BP 118/80   Pulse 99   Temp 98.7 F (37.1 C) (Oral)   Wt 177 lb (80.3 kg)   BMI 33.44 kg/m  Wt Readings from Last 5 Encounters:  12/14/17 177 lb (80.3 kg)  09/13/17 181 lb (82.1 kg)  06/09/17 179 lb (81.2 kg)  04/06/17 182 lb (82.6 kg)  03/15/17 181 lb (82.1 kg)    Gen: Well NAD HEENT: EOMI,  MMM Lungs: Normal work of breathing. CTABL Heart: RRR no MRG Abd: NABS, Soft. Nondistended, Nontender Exts: Brisk capillary refill, warm and well perfused.   Lab and Radiology Results Recent Results (from the past 2160 hour(s))  CBC     Status: Abnormal   Collection Time: 11/27/17  8:51 AM  Result Value Ref Range   WBC 7.8 3.8 - 10.8 Thousand/uL   RBC 5.21 (H) 3.80 - 5.10 Million/uL   Hemoglobin 14.6 11.7 - 15.5 g/dL   HCT 16.1 09.6 - 04.5 %   MCV 82.5 80.0 - 100.0 fL   MCH 28.0 27.0 - 33.0 pg   MCHC 34.0 32.0 - 36.0 g/dL   RDW 40.9 81.1 - 91.4 %   Platelets 226 140 - 400 Thousand/uL   MPV 10.6 7.5 - 12.5 fL  COMPLETE METABOLIC PANEL WITH GFR     Status: Abnormal   Collection Time: 11/27/17  8:51 AM  Result Value Ref Range   Glucose, Bld 197 (H) 65 - 99 mg/dL    Comment: .            Fasting reference interval . For someone without known diabetes, a glucose value >125 mg/dL indicates that they may have diabetes and this should be confirmed with a follow-up test. .    BUN 15 7 - 25 mg/dL   Creat 7.82 9.56 - 2.13 mg/dL    Comment: For patients >21 years of age, the reference limit for Creatinine is approximately 13% higher for people identified as African-American. .    GFR, Est Non African American 103 > OR = 60 mL/min/1.18m2   GFR, Est African American 119 > OR = 60 mL/min/1.31m2   BUN/Creatinine Ratio NOT APPLICABLE 6 - 22 (calc)   Sodium 140 135 - 146 mmol/L   Potassium 4.0 3.5 - 5.3 mmol/L   Chloride 106 98 - 110 mmol/L   CO2 23 20 - 32 mmol/L   Calcium 9.6 8.6 - 10.4 mg/dL   Total Protein 6.7 6.1 - 8.1 g/dL   Albumin 4.6 3.6 - 5.1 g/dL   Globulin 2.1 1.9 - 3.7 g/dL (calc)   AG Ratio 2.2 1.0 - 2.5 (calc)   Total Bilirubin  0.4 0.2 - 1.2 mg/dL   Alkaline phosphatase (APISO) 62 33 - 130 U/L   AST 19 10 - 35 U/L   ALT 25 6 - 29 U/L  Lipid Panel w/reflex Direct LDL     Status: Abnormal   Collection Time: 11/27/17  8:51 AM  Result Value Ref Range   Cholesterol 157 <200 mg/dL   HDL 34 (L) >25 mg/dL   Triglycerides 498 (H) <150 mg/dL    Comment: . If a non-fasting specimen was collected, consider repeat triglyceride testing on a fasting specimen if clinically indicated.  Perry Mount et al. J. of Clin. Lipidol. 2015;9:129-169. [name]    LDL Cholesterol (Calc) 89 mg/dL (calc)    Comment: Reference range: <100 . Desirable range <100 mg/dL for primary prevention;   <70 mg/dL for patients with CHD or diabetic patients  with > or = 2 CHD risk factors. [name] LDL-C is now calculated using the Martin-Hopkins  calculation, which is a validated novel method providing  better accuracy than the Friedewald equation in the  estimation of LDL-C.    Horald Pollen et al. Lenox Ahr. 2641;583(09): 2061-2068  (http://education.[facility].com/faq/FAQ164)    Total CHOL/HDL Ratio 4.6 <5.0 (calc)   Non-HDL Cholesterol (Calc) 123 <130 mg/dL (calc)    Comment: For patients with diabetes plus 1 major ASCVD risk  factor, treating to a non-HDL-C goal of <100 mg/dL  (LDL-C of <40 mg/dL) is considered a therapeutic  option.   Hemoglobin A1c     Status: Abnormal   Collection Time: 11/27/17  9:02 AM  Result Value Ref Range   Hgb A1c MFr Bld 8.7 (H) <5.7 % of total Hgb    Comment: For someone without known diabetes, a hemoglobin A1c value of 6.5% or greater indicates that they may have  diabetes and this should be confirmed with a follow-up  test. . For someone with known diabetes, a value <7% indicates  that their diabetes is well controlled and a value  greater than or equal to 7% indicates suboptimal  control. A1c targets should be individualized based on  duration of diabetes, age, comorbid conditions, and  other considerations. . Currently, no consensus exists regarding use of hemoglobin A1c for diagnosis of diabetes for children. .    Mean Plasma Glucose 203 (calc)   eAG (mmol/L) 11.2 (calc)  Microalbumin, urine     Status: None   Collection Time: 11/27/17  9:05 AM  Result Value Ref Range   Microalb, Ur 5.1 mg/dL    Comment: Reference Range Not established    RAM      Comment: . The ADA defines abnormalities in albumin excretion as follows: [name] Category         Result (mcg/mg creatinine) . Normal                    <30 Microalbuminuria         30-299  Clinical albuminuria   > OR = 300 . The ADA recommends that at least two of three specimens collected within a 3-6 month period be abnormal before considering a patient to be within a diagnostic category.       Assessment and Plan: 57 y.o. female with  Diabetes: Not ideally controlled.  Patient would like to avoid more medications if possible.  Plan to work on reducing  carbohydrates.  Focused on getting fewer than 40 to 60 g of carbs per meal.  Recheck in 3 months.  Get diabetic eye exam in the near future.  Hypertension well-controlled continue current regimen.  Hyperlipidemia doing well continue current regimen.  Smoking: Work on continuing to cut back on smoking.  Health maintenance: Mammogram ordered.  Anticipate results from diabetic eye exam.  Otherwise healthy and is up-to-date.  Patient would like to delay flu vaccine until next check.   Orders Placed This Encounter  Procedures  . MM 3D SCREEN BREAST BILATERAL    Standing Status:   Future    Standing Expiration Date:   02/14/2019    Order Specific Question:   Reason for Exam (SYMPTOM  OR DIAGNOSIS REQUIRED)    Answer:   screen breast cancer    Order Specific Question:   Is the patient pregnant?    Answer:   No    Order Specific Question:   Preferred imaging location?    Answer:   [facility]   Meds ordered this encounter  Medications  . rosuvastatin (CRESTOR) 40 MG tablet    Sig: Take 1 tablet (40 mg total) by mouth daily.    Dispense:  90 tablet    Refill:  3  . clorazepate (TRANXENE) 3.75 MG tablet    Sig: Take 1 tablet (3.75 mg total) by mouth at bedtime.    Dispense:  30 tablet    Refill:  4     Historical information moved to improve visibility of documentation.  Past Medical History:  Diagnosis Date  . Diabetes mellitus without complication ([facility])   . Hyperlipidemia   . Hypertension    Past Surgical History:  Procedure Laterality Date  . ANKLE SURGERY     Social History   Tobacco Use  . Smoking status: Current Every Day Smoker    Packs/day: 0.50    Years: 20.00    Pack years: 10.00  . Smokeless tobacco: Never Used  Substance Use Topics  . Alcohol use: Yes    Alcohol/week: 0.0 standard drinks    Comment: Drinks 6-7 beers in one day socially every couple weeks   family history includes Alcohol abuse in her father; Aneurysm in her sister; Diabetes in  her mother; Heart disease in her father.  Medications: Current Outpatient Medications  Medication Sig Dispense Refill  . aspirin EC 81 MG tablet Take 81 mg by mouth daily.    . clorazepate (TRANXENE) 3.75 MG tablet Take 1 tablet (3.75 mg total) by mouth at bedtime. 30 tablet 4  . fluticasone (FLONASE) 50 MCG/ACT nasal spray USE TWO SPRAY(S) IN EACH NOSTRIL ONCE DAILY 16 g 12  . glucose blood (BAYER CONTOUR NEXT TEST) test strip Test daily. E11.65 100 each 12  . JARDIANCE 25 MG TABS tablet TAKE ONE TABLET BY MOUTH ONCE DAILY 90 tablet 3  . KOMBIGLYZE XR 5-500 MG TB24 TAKE 1 TABLET BY MOUTH ONCE DAILY 90 tablet 3  . metoprolol succinate (TOPROL-XL) 100 MG 24 hr tablet TAKE 1 TABLET BY MOUTH ONCE DAILY. TAKE WITH OR IMMEDIATELY FOLLOWING A MEAL. 90 tablet 3  . [START ON 12/27/2017] rosuvastatin (CRESTOR) 40 MG tablet Take 1 tablet (40 mg total) by mouth daily. 90 tablet 3   No current facility-administered medications for this visit.    Allergies  Allergen Reactions  . Lisinopril Swelling    Non-life-threatening angioedema on lisinopril  . Sulfa Antibiotics      Discussed warning signs or symptoms. Please see discharge instructions. Patient expresses understanding.

## 2017-12-20 ENCOUNTER — Ambulatory Visit (INDEPENDENT_AMBULATORY_CARE_PROVIDER_SITE_OTHER): Payer: BLUE CROSS/BLUE SHIELD

## 2017-12-20 DIAGNOSIS — Z1231 Encounter for screening mammogram for malignant neoplasm of breast: Secondary | ICD-10-CM | POA: Diagnosis not present

## 2017-12-20 DIAGNOSIS — Z1239 Encounter for other screening for malignant neoplasm of breast: Secondary | ICD-10-CM

## 2018-01-02 ENCOUNTER — Other Ambulatory Visit: Payer: Self-pay | Admitting: Family Medicine

## 2018-01-02 DIAGNOSIS — E1165 Type 2 diabetes mellitus with hyperglycemia: Principal | ICD-10-CM

## 2018-01-02 DIAGNOSIS — IMO0001 Reserved for inherently not codable concepts without codable children: Secondary | ICD-10-CM

## 2018-01-09 LAB — HM DIABETES EYE EXAM

## 2018-01-10 ENCOUNTER — Telehealth: Payer: Self-pay | Admitting: Family Medicine

## 2018-01-10 NOTE — Telephone Encounter (Signed)
Received diabetic eye exam report from [facility].  Date of service was September 30.  No diabetic retinopathy present.  Results will be abstracted.

## 2018-01-12 ENCOUNTER — Encounter: Payer: Self-pay | Admitting: Family Medicine

## 2018-02-27 ENCOUNTER — Ambulatory Visit (INDEPENDENT_AMBULATORY_CARE_PROVIDER_SITE_OTHER): Payer: BLUE CROSS/BLUE SHIELD | Admitting: Family Medicine

## 2018-02-27 ENCOUNTER — Encounter: Payer: Self-pay | Admitting: Family Medicine

## 2018-02-27 VITALS — BP 142/92 | HR 97 | Ht 61.0 in | Wt 183.0 lb

## 2018-02-27 DIAGNOSIS — E11649 Type 2 diabetes mellitus with hypoglycemia without coma: Secondary | ICD-10-CM

## 2018-02-27 DIAGNOSIS — I152 Hypertension secondary to endocrine disorders: Secondary | ICD-10-CM

## 2018-02-27 DIAGNOSIS — F17200 Nicotine dependence, unspecified, uncomplicated: Secondary | ICD-10-CM

## 2018-02-27 DIAGNOSIS — E6609 Other obesity due to excess calories: Secondary | ICD-10-CM

## 2018-02-27 DIAGNOSIS — I10 Essential (primary) hypertension: Secondary | ICD-10-CM

## 2018-02-27 DIAGNOSIS — S39012A Strain of muscle, fascia and tendon of lower back, initial encounter: Secondary | ICD-10-CM

## 2018-02-27 DIAGNOSIS — E782 Mixed hyperlipidemia: Secondary | ICD-10-CM

## 2018-02-27 DIAGNOSIS — E1159 Type 2 diabetes mellitus with other circulatory complications: Secondary | ICD-10-CM

## 2018-02-27 DIAGNOSIS — Z6834 Body mass index (BMI) 34.0-34.9, adult: Secondary | ICD-10-CM

## 2018-02-27 LAB — POCT GLYCOSYLATED HEMOGLOBIN (HGB A1C): HEMOGLOBIN A1C: 9.3 % — AB (ref 4.0–5.6)

## 2018-02-27 MED ORDER — CYCLOBENZAPRINE HCL 10 MG PO TABS: 10.0000 mg | ORAL_TABLET | Freq: Three times a day (TID) | ORAL | 0 refills | Status: AC | PRN

## 2018-02-27 NOTE — Progress Notes (Signed)
Melanie Nelson is a 57 y.o. female who presents to [facility]: Primary Care Sports Medicine today for low back pain.   [name] notes a 6-day history of left lower back pain.  Pain occurred without injury.  She denies significant radiating pain weakness or numbness.  Pain is worse with activity and better with rest.  She has tried Tylenol and heating pad which helps a bit.  She has a TENS unit but has not tried it yet for her back.  She denies any bowel or bladder dysfunction.  She feels well otherwise.  Diabetes:Echo takes medications listed below for diabetes.  She notes that she has been less adherent to her diabetes diet recently.  She does not check her blood sugar and denies any polyuria or polydipsia or hyper or hypoglycemic episodes.  Weight loss will help.Melanie Nelson  Hypertension:Melanie Nelson She tolerates it well with no lightheadedness or dizziness a.  She does not check her blood pressure regularly.  Hyperlipidemia: Melanie Nelson takes rosuvastatin.  She tolerates it well without significant chronic muscle aches or pains.  Melanie Nelson notes that her insurance is switching and Melanie Nelson is no longer the preferred medical provider.  She thinks she will be switching her care to [facility] family medicine which is a [facility] health care provider.   ROS as above:  Exam:  BP (!) 142/92   Pulse 97   Ht 5\' 1"  (1.549 m)   Wt 183 lb (83 kg)   BMI 34.58 kg/m  Wt Readings from Last 5 Encounters:  02/27/18 183 lb (83 kg)  12/14/17 177 lb (80.3 kg)  09/13/17 181 lb (82.1 kg)  06/09/17 179 lb (81.2 kg)  04/06/17 182 lb (82.6 kg)    Gen: Well NAD HEENT: EOMI,  MMM Lungs: Normal work of breathing. CTABL Heart: RRR no MRG Abd: NABS, Soft. Nondistended, Nontender Exts: Brisk capillary refill, warm and well perfused.  MSK: L-spine: Nontender to spinal midline.  Tender palpation lumbar paraspinal  musculature. Lumbar motion intact but painful to flexion and rotation. Lower extreme strength reflexes and sensation are equal and normal throughout.   Lab and Radiology Results Results for orders placed or performed in visit on 02/27/18 (from the past 72 hour(s))  POCT HgB A1C     Status: Abnormal   Collection Time: 02/27/18  1:36 PM  Result Value Ref Range   Hemoglobin A1C 9.3 (A) 4.0 - 5.6 %   HbA1c POC (<> result, manual entry)     HbA1c, POC (prediabetic range)     HbA1c, POC (controlled diabetic range)     No results found.    Assessment and Plan: 57 y.o. female with low back strain: Myofascial strain and dysfunction.  Plan for home exercise program, heating pad, Tylenol, cyclobenzaprine, and TENS unit.  Refer to physical therapy if not improving.  Diabetes: Not well controlled worsening.  Discussed options.  Patient would like very much to avoid insulin.  She already is on maximum oral medications.  Potential to switch to GLP-1 but I do not anticipate much gain from the DPP 4 that she is already taking.  Next step would be likely basal insulin.  She plans to be more adherent to a low carb diet.  Recheck with me in 3 months or new PCP.  Hypertension: Blood pressure bit elevated today likely due to pain.  Previously pretty well controlled.  Continue current regimen.  Lipids: Doing well tolerating medication well.  Plan to check fasting  lipids in the near future.     Orders Placed This Encounter  Procedures  . Ambulatory referral to Physical Therapy    Referral Priority:   Routine    Referral Type:   Physical Medicine    Referral Reason:   Specialty Services Required    Requested Specialty:   Physical Therapy  . POCT HgB A1C   Meds ordered this encounter  Medications  . cyclobenzaprine (FLEXERIL) 10 MG tablet    Sig: Take 1 tablet (10 mg total) by mouth 3 (three) times daily as needed for muscle spasms.    Dispense:  30 tablet    Refill:  0     Historical  information moved to improve visibility of documentation.  Past Medical History:  Diagnosis Date  . Diabetes mellitus without complication ([facility])   . Hyperlipidemia   . Hypertension    Past Surgical History:  Procedure Laterality Date  . ANKLE SURGERY     Social History   Tobacco Use  . Smoking status: Current Every Day Smoker    Packs/day: 0.50    Years: 20.00    Pack years: 10.00  . Smokeless tobacco: Never Used  Substance Use Topics  . Alcohol use: Yes    Alcohol/week: 0.0 standard drinks    Comment: Drinks 6-7 beers in one day socially every couple weeks   family history includes Alcohol abuse in her father; Aneurysm in her sister; Diabetes in her mother; Heart disease in her father.  Medications: Current Outpatient Medications  Medication Sig Dispense Refill  . aspirin EC 81 MG tablet Take 81 mg by mouth daily.    . clorazepate (TRANXENE) 3.75 MG tablet Take 1 tablet (3.75 mg total) by mouth at bedtime. 30 tablet 4  . fluticasone (FLONASE) 50 MCG/ACT nasal spray USE TWO SPRAY(S) IN EACH NOSTRIL ONCE DAILY 16 g 12  . glucose blood (BAYER CONTOUR NEXT TEST) test strip Test daily. E11.65 100 each 12  . JARDIANCE 25 MG TABS tablet TAKE 1 TABLET BY MOUTH ONCE DAILY 90 tablet 3  . KOMBIGLYZE XR 5-500 MG TB24 TAKE 1 TABLET BY MOUTH ONCE DAILY 90 tablet 3  . metoprolol succinate (TOPROL-XL) 100 MG 24 hr tablet TAKE 1 TABLET BY MOUTH ONCE DAILY. TAKE WITH OR IMMEDIATELY FOLLOWING A MEAL. 90 tablet 3  . rosuvastatin (CRESTOR) 40 MG tablet Take 1 tablet (40 mg total) by mouth daily. 90 tablet 3  . cyclobenzaprine (FLEXERIL) 10 MG tablet Take 1 tablet (10 mg total) by mouth 3 (three) times daily as needed for muscle spasms. 30 tablet 0   No current facility-administered medications for this visit.    Allergies  Allergen Reactions  . Lisinopril Swelling    Non-life-threatening angioedema on lisinopril  . Sulfa Antibiotics      Discussed warning signs or symptoms. Please see  discharge instructions. Patient expresses understanding.

## 2018-02-27 NOTE — Patient Instructions (Addendum)
Thank you for coming in today.  Attend PT as needed.  Use a heating pad. Use TENS unit.  Use muscle relaxer as needed at bedtime.  Continue tylenol.   [facility] let me know.   For diabetes reduce carbs or next step is insulin.  Recheck in 3 months or sooner if needed.   TENS UNIT: This is helpful for muscle pain and spasm.   Search and Purchase a TENS 7000 2nd edition at  www.tenspros.com or www.[employer].com It should be less than $30.     TENS unit instructions: Do not shower or bathe with the unit on Turn the unit off before removing electrodes or batteries If the electrodes lose stickiness add a drop of water to the electrodes after they are disconnected from the unit and place on plastic sheet. If you continued to have difficulty, call the TENS unit company to purchase more electrodes. Do not apply lotion on the skin area prior to use. Make sure the skin is clean and dry as this will help prolong the life of the electrodes. After use, always check skin for unusual red areas, rash or other skin difficulties. If there are any skin problems, does not apply electrodes to the same area. Never remove the electrodes from the unit by pulling the wires. Do not use the TENS unit or electrodes other than as directed. Do not change electrode placement without consultating your therapist or physician. Keep 2 fingers with between each electrode. Wear time ratio is 2:1, on to off times.    For example on for 30 minutes off for 15 minutes and then on for 30 minutes off for 15 minutes

## 2018-03-01 ENCOUNTER — Encounter: Payer: Self-pay | Admitting: Family Medicine

## 2018-03-15 ENCOUNTER — Ambulatory Visit: Payer: BLUE CROSS/BLUE SHIELD | Admitting: Family Medicine

## 2018-03-24 ENCOUNTER — Other Ambulatory Visit: Payer: Self-pay | Admitting: Family Medicine

## 2018-03-24 DIAGNOSIS — E1165 Type 2 diabetes mellitus with hyperglycemia: Principal | ICD-10-CM

## 2018-03-24 DIAGNOSIS — IMO0001 Reserved for inherently not codable concepts without codable children: Secondary | ICD-10-CM

## 2018-04-05 ENCOUNTER — Other Ambulatory Visit: Payer: Self-pay | Admitting: Family Medicine

## 2018-04-05 DIAGNOSIS — E1165 Type 2 diabetes mellitus with hyperglycemia: Principal | ICD-10-CM

## 2018-04-05 DIAGNOSIS — IMO0001 Reserved for inherently not codable concepts without codable children: Secondary | ICD-10-CM

## 2018-07-13 ENCOUNTER — Other Ambulatory Visit: Payer: Self-pay | Admitting: Family Medicine

## 2018-07-13 DIAGNOSIS — J301 Allergic rhinitis due to pollen: Secondary | ICD-10-CM

## 2018-12-06 ENCOUNTER — Other Ambulatory Visit: Payer: Self-pay | Admitting: Family Medicine

## 2018-12-06 DIAGNOSIS — Z1231 Encounter for screening mammogram for malignant neoplasm of breast: Secondary | ICD-10-CM

## 2018-12-26 ENCOUNTER — Ambulatory Visit: Payer: BLUE CROSS/BLUE SHIELD

## 2018-12-31 ENCOUNTER — Other Ambulatory Visit: Payer: Self-pay | Admitting: Family Medicine

## 2018-12-31 DIAGNOSIS — J301 Allergic rhinitis due to pollen: Secondary | ICD-10-CM

## 2019-01-08 IMAGING — MG DIGITAL SCREENING BILATERAL MAMMOGRAM WITH TOMO AND CAD
8 of 14 series · 9 of 40 positions shown · non-contrast
Comparison: Previous exam(s).

ACR Breast Density Category a: The breast tissue is almost entirely
fatty.

CLINICAL DATA: Screening.

EXAM:
DIGITAL SCREENING BILATERAL MAMMOGRAM WITH TOMO AND CAD

[L CC synth-2D]
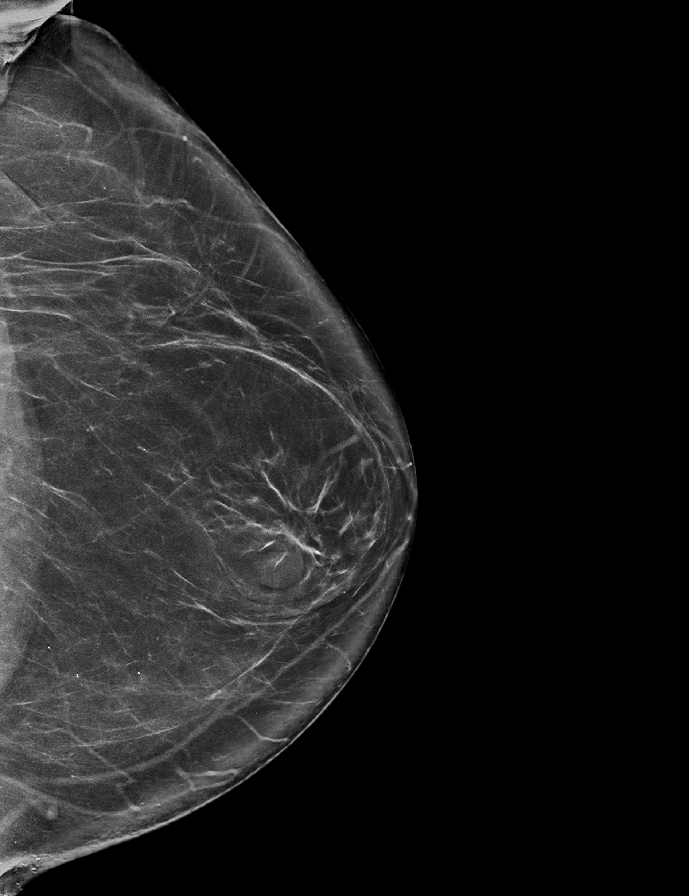

[L XCCL synth-2D]
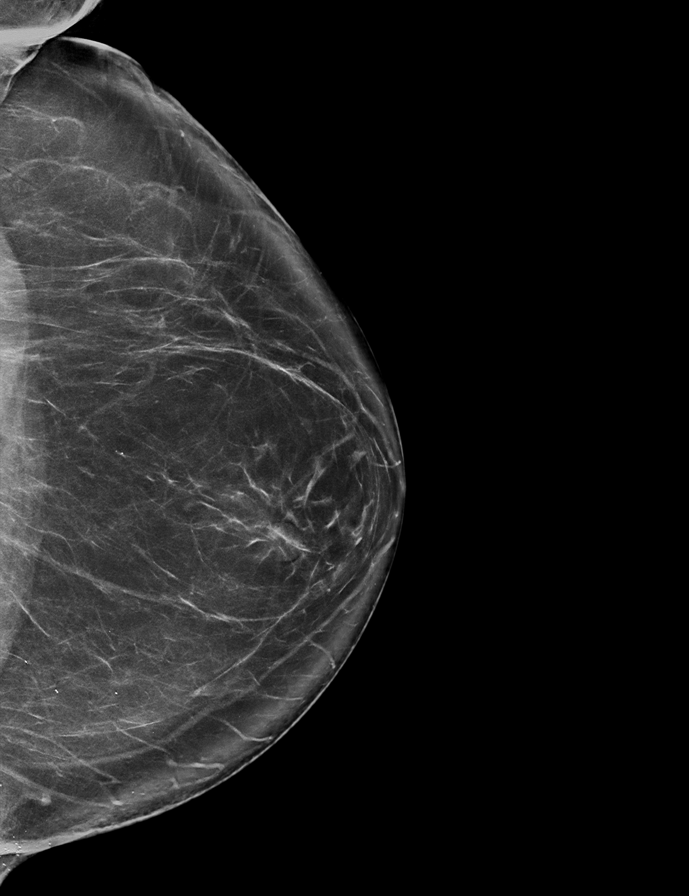

[R CC synth-2D]
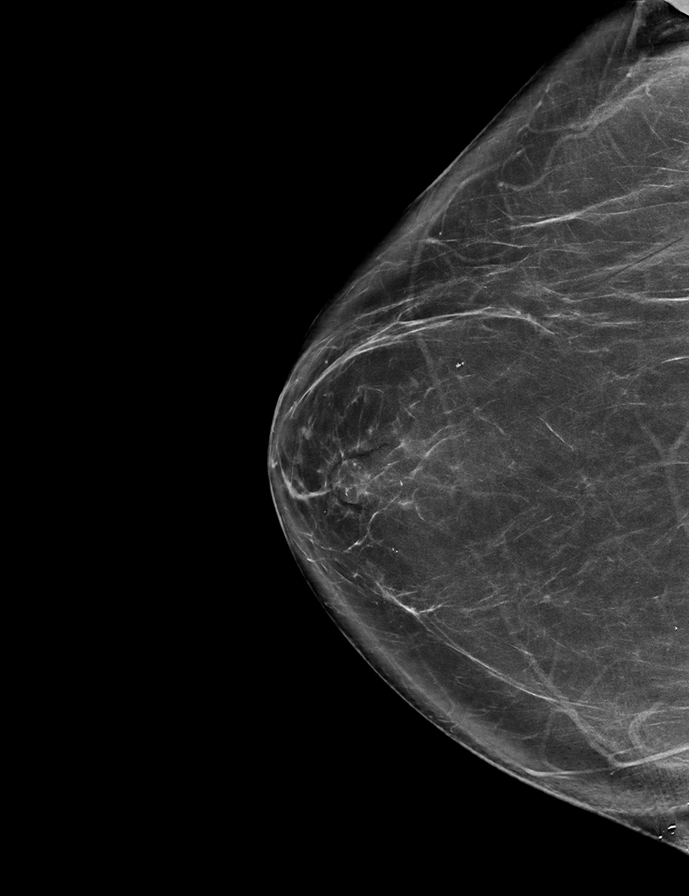

[R MLO synth-2D (1 of 2)]
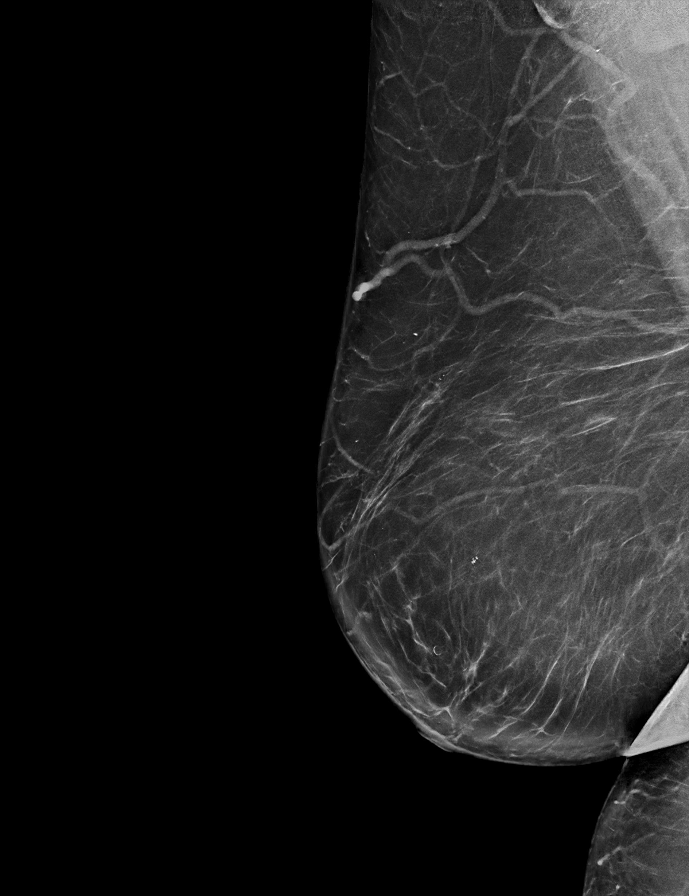

[R MLO synth-2D (2 of 2)]
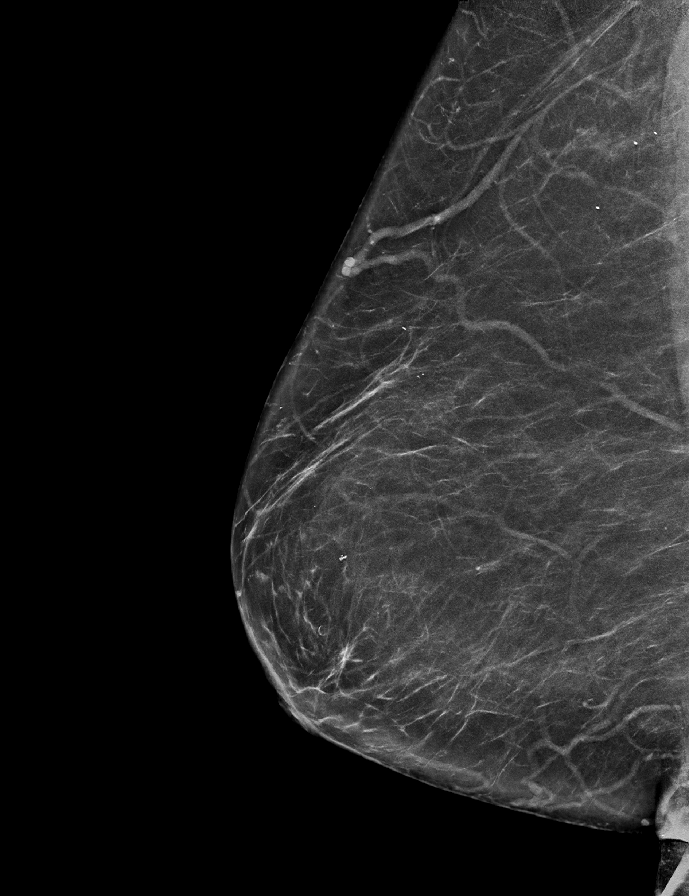

[L MLO synth-2D]
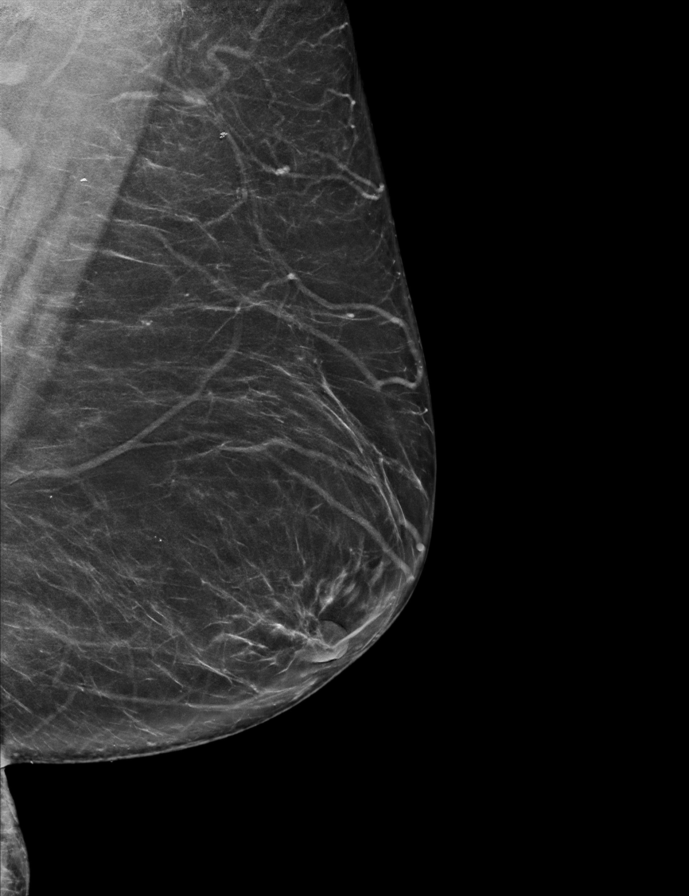

[R XCCL synth-2D]
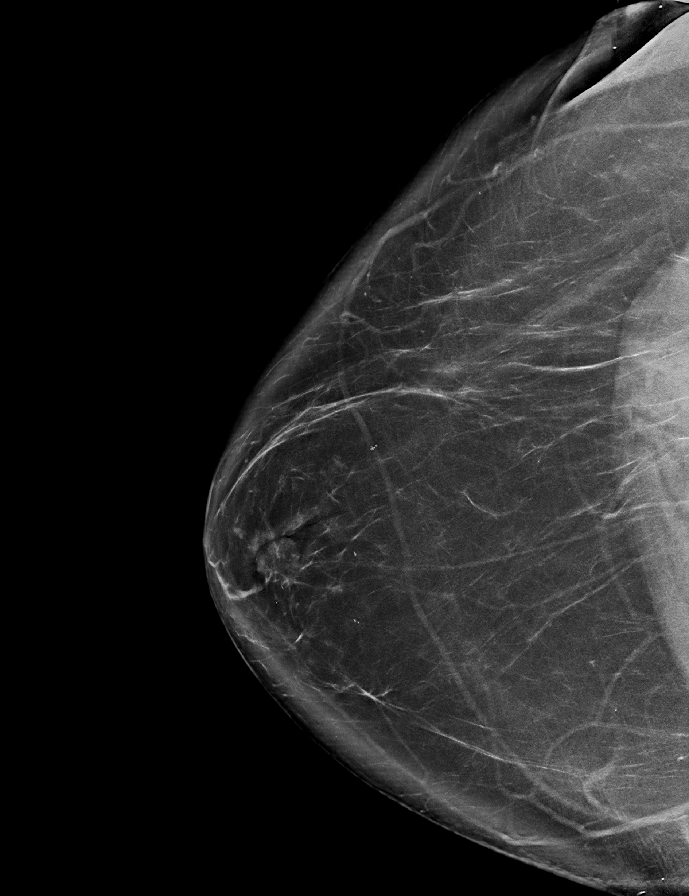

[R MLO tomo · 2 of 77 frames shown]
[frame 25/77]
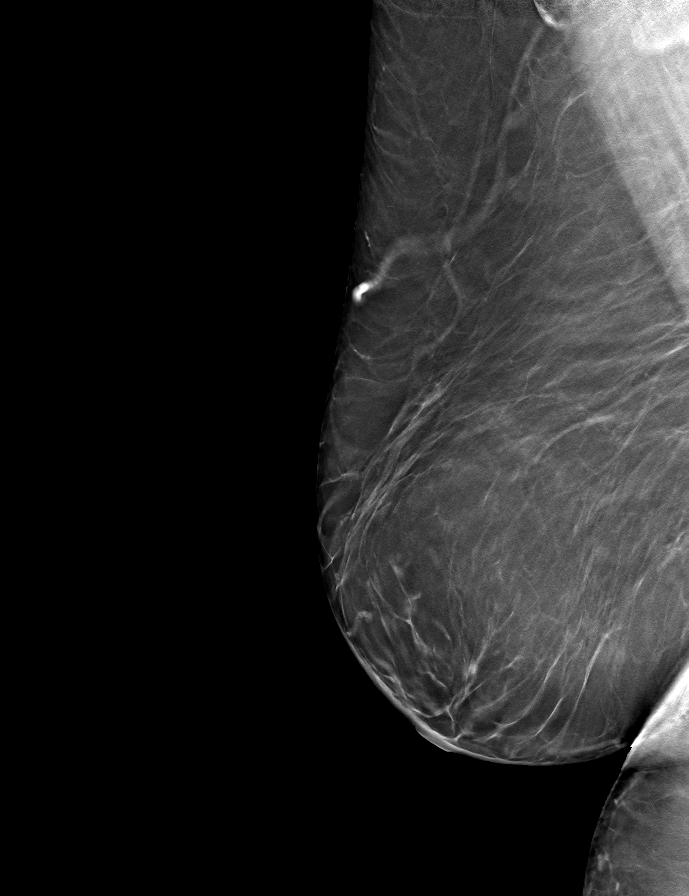
[frame 39/77]
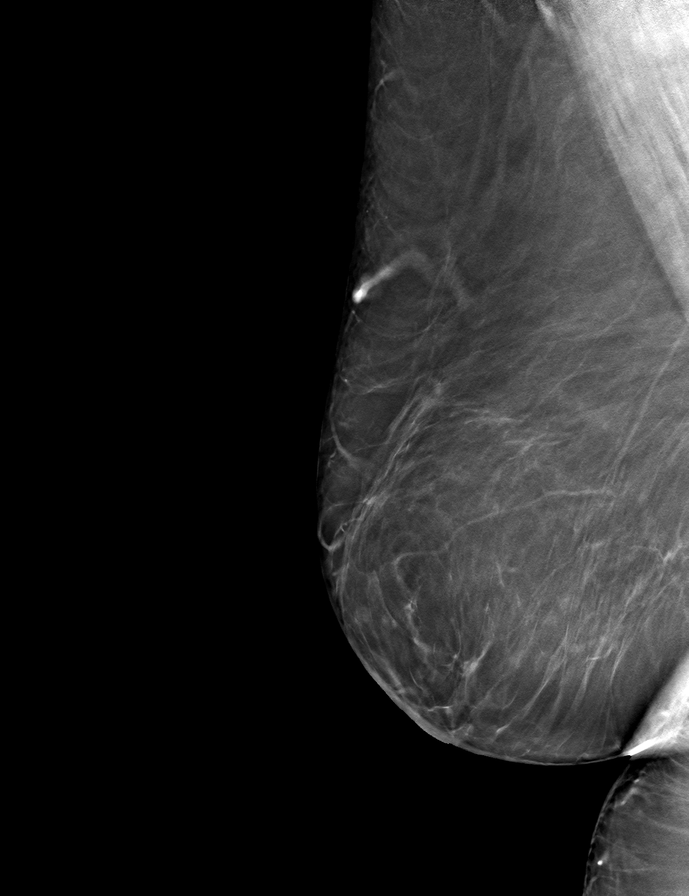

[9 of 40 positions shown; findings below may reference images not displayed]

FINDINGS: There are no findings suspicious for malignancy. Images were
processed with CAD.
IMPRESSION: No mammographic evidence of malignancy. A result letter of this
screening mammogram will be mailed directly to the patient.

RECOMMENDATION:
Screening mammogram in one year. (Code:[id number])

BI-RADS CATEGORY  1: Negative.

## 2019-03-02 ENCOUNTER — Other Ambulatory Visit: Payer: Self-pay | Admitting: Family Medicine

## 2019-03-02 DIAGNOSIS — J301 Allergic rhinitis due to pollen: Secondary | ICD-10-CM

## 2019-05-06 ENCOUNTER — Other Ambulatory Visit: Payer: Self-pay | Admitting: Physician Assistant

## 2019-05-06 DIAGNOSIS — J301 Allergic rhinitis due to pollen: Secondary | ICD-10-CM

## 2019-06-14 ENCOUNTER — Other Ambulatory Visit: Payer: Self-pay | Admitting: Physician Assistant

## 2019-06-14 DIAGNOSIS — J301 Allergic rhinitis due to pollen: Secondary | ICD-10-CM

## 2019-06-17 ENCOUNTER — Other Ambulatory Visit: Payer: Self-pay | Admitting: Neurology

## 2019-06-19 ENCOUNTER — Other Ambulatory Visit: Payer: Self-pay | Admitting: Physician Assistant

## 2019-06-19 DIAGNOSIS — J301 Allergic rhinitis due to pollen: Secondary | ICD-10-CM

## 2019-06-22 ENCOUNTER — Other Ambulatory Visit: Payer: Self-pay | Admitting: Physician Assistant

## 2019-06-22 DIAGNOSIS — J301 Allergic rhinitis due to pollen: Secondary | ICD-10-CM

## 2019-06-27 ENCOUNTER — Other Ambulatory Visit: Payer: Self-pay | Admitting: Physician Assistant

## 2019-06-27 DIAGNOSIS — J301 Allergic rhinitis due to pollen: Secondary | ICD-10-CM

## 2019-10-23 ENCOUNTER — Other Ambulatory Visit: Payer: Self-pay | Admitting: Family Medicine
